# Patient Record
Sex: Male | Born: 1980 | Race: White | Hispanic: No | Marital: Married | Smoking: Former smoker
Health system: Southern US, Community
[De-identification: ages and names within clinical notes are randomized; demographics above are authoritative.]

## PROBLEM LIST (undated history)

## (undated) DIAGNOSIS — T7840XA Allergy, unspecified, initial encounter: Secondary | ICD-10-CM

## (undated) DIAGNOSIS — I10 Essential (primary) hypertension: Secondary | ICD-10-CM

## (undated) DIAGNOSIS — E785 Hyperlipidemia, unspecified: Secondary | ICD-10-CM

## (undated) DIAGNOSIS — F419 Anxiety disorder, unspecified: Secondary | ICD-10-CM

## (undated) HISTORY — PX: APPENDECTOMY: SHX54

## (undated) HISTORY — DX: Essential (primary) hypertension: I10

## (undated) HISTORY — DX: Anxiety disorder, unspecified: F41.9

## (undated) HISTORY — DX: Allergy, unspecified, initial encounter: T78.40XA

## (undated) HISTORY — DX: Hyperlipidemia, unspecified: E78.5

---
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
## 2010-02-07 DIAGNOSIS — J302 Other seasonal allergic rhinitis: Secondary | ICD-10-CM

## 2010-02-07 HISTORY — DX: Other seasonal allergic rhinitis: J30.2

## 2011-02-08 DIAGNOSIS — F419 Anxiety disorder, unspecified: Secondary | ICD-10-CM

## 2011-02-08 HISTORY — PX: WISDOM TOOTH EXTRACTION: SHX21

## 2011-02-08 HISTORY — DX: Anxiety disorder, unspecified: F41.9

## 2016-08-17 ENCOUNTER — Ambulatory Visit: Payer: Self-pay

## 2016-09-07 ENCOUNTER — Ambulatory Visit (INDEPENDENT_AMBULATORY_CARE_PROVIDER_SITE_OTHER): Payer: BLUE CROSS/BLUE SHIELD | Admitting: Urology

## 2016-09-07 ENCOUNTER — Encounter: Payer: Self-pay | Admitting: Urology

## 2016-09-07 VITALS — BP 116/67 | HR 76 | Ht 72.0 in | Wt >= 6400 oz

## 2016-09-07 DIAGNOSIS — Z3009 Encounter for other general counseling and advice on contraception: Secondary | ICD-10-CM

## 2016-09-07 MED ORDER — DIAZEPAM 10 MG PO TABS: 10.0000 mg | ORAL_TABLET | Freq: Once | ORAL | 0 refills | Status: AC

## 2016-09-07 MED ORDER — DIAZEPAM 10 MG PO TABS: 10.0000 mg | ORAL_TABLET | Freq: Once | ORAL | 0 refills | Status: DC

## 2016-09-07 NOTE — Progress Notes (Signed)
09/07/2016 11:59 AM   Danny Jenkins 10/31/1980 [id number]  Referring provider: No referring provider defined for this encounter.  Chief Complaint  Patient presents with  . VAS Consult    HPI: Here to consider vasectomy.  Married and has a three yo boy and 2 mo old daughter. They've considered vasectomy for about three years. They tried OCP's, [facility]. No inguinal / scrotal surgery or pain. No LUTS.    Modifying factors: There are no other modifying factors  Associated signs and symptoms: There are no other associated signs and symptoms Aggravating and relieving factors: There are no other aggravating or relieving factors Severity: Moderate Duration: Persistent    PMH: History reviewed. No pertinent past medical history.  Surgical History: Past Surgical History:  Procedure Laterality Date  . APPENDECTOMY    . EYE SURGERY  1987    Home Medications:  Allergies as of 09/07/2016      Reactions   Sulfa Antibiotics Hives      Medication List    as of 09/07/2016 11:59 AM   You have not been prescribed any medications.     Allergies:  Allergies  Allergen Reactions  . Sulfa Antibiotics Hives    Family History: Family History  Problem Relation Age of Onset  . Prostate cancer Neg Hx   . Bladder Cancer Neg Hx   . Kidney cancer Neg Hx     Social History:  reports that he has quit smoking. He has never used smokeless tobacco. He reports that he drinks alcohol. He reports that he does not use drugs.  ROS: UROLOGY Frequent Urination?: No Hard to postpone urination?: No Burning/pain with urination?: No Get up at night to urinate?: No Leakage of urine?: No Urine stream starts and stops?: No Trouble starting stream?: No Do you have to strain to urinate?: No Blood in urine?: No Urinary tract infection?: No Sexually transmitted disease?: No Injury to kidneys or bladder?: No Painful intercourse?: No Weak stream?: No Erection problems?:  No Penile pain?: No  Gastrointestinal Nausea?: No Vomiting?: No Indigestion/heartburn?: No Diarrhea?: No Constipation?: No  Constitutional Fever: No Night sweats?: No Weight loss?: No Fatigue?: No  Skin Skin rash/lesions?: No Itching?: No  Eyes Blurred vision?: No Double vision?: No  Ears/Nose/Throat Sore throat?: No Sinus problems?: No  Hematologic/Lymphatic Swollen glands?: No Easy bruising?: No  Cardiovascular Leg swelling?: No Chest pain?: No  Respiratory Cough?: No Shortness of breath?: No  Endocrine Excessive thirst?: No  Musculoskeletal Back pain?: No Joint pain?: No  Neurological Headaches?: No Dizziness?: No  Psychologic Depression?: No Anxiety?: No  Physical Exam: BP 116/67 (BP Location: Left Arm, Patient Position: Sitting, Cuff Size: Large)   Pulse 76   Ht 6' (1.829 m)   Wt (!) 194.9 kg (429 lb 9.6 oz)   BMI 58.26 kg/m   Constitutional:  Alert and oriented, No acute distress. HEENT: [place] AT, moist mucus membranes.  Trachea midline, no masses. Cardiovascular: No clubbing, cyanosis, or edema. Respiratory: Normal respiratory effort, no increased work of breathing. GI: Abdomen is soft, nontender, nondistended, no abdominal masses GU: No CVA tenderness.  Penis - buried Scrotum - normal, loose Testicles - easy to examine, testicles bilateral vas deferens palpably normal. Skin: No rashes, bruises or suspicious lesions. Lymph: No cervical or inguinal adenopathy. Neurologic: Grossly intact, no focal deficits, moving all 4 extremities. Psychiatric: Normal mood and affect.  Laboratory Data: No results found for: WBC, HGB, HCT, MCV, PLT  No results found for: CREATININE  No results found for: PSA  No results found for: TESTOSTERONE  No results found for: HGBA1C  Urinalysis No results found for: COLORURINE, APPEARANCEUR, LABSPEC, PHURINE, GLUCOSEU, HGBUR, BILIRUBINUR, KETONESUR, PROTEINUR, UROBILINOGEN, NITRITE,  LEUKOCYTESUR   Assessment & Plan:  The patient was prepped and draped in the usual sterile fashion. A timeout was performed to confirm the patient and procedure. The skin and left vas was grasped and infiltrated with local. A puncture hole was made with a sharp hemostat and the vas was grasped with a ring Clamp and And brought out through the incision. The sharp hemostat was used to clear the vasal fascia and once clear a 1 cm segment of the vas was excised and removed.The needlepoint Bovie was placed in the proximal and distal lumens and cautery was performed. The proximal and distal lumens were buried within the sheath with a 3-0 silk suture providing good fascial interposition. Hemostasis was excellent. The right vas was isolated and through the same puncture whole, was infiltrated with local, a segment excised, the lumens cauterized, and both ends buried under the sheath. Hemostasis was excellent. The puncture hole was closed with a single horizontal mattress suture. The patient tolerated the procedure well. He was cleaned and a dressing applied. Discussed again post-op restrictions. Also discussed again vasectomy does not produce immediate sterilization (which can take several months), therefore continue other birth control. Despite his size, the testicles and vas deferens are quite easy to access and examine. We discussed on occasion the procedure has to be done under anesthesia in the OR. Also,one of my colleagues may be doing the procedure.   There are no diagnoses linked to this encounter.  No Follow-up on file.  Jerilee FieldESKRIDGE, Adonys Wildes, MD  [facility] [address], [address] [place], [place] [address] [phone]

## 2016-11-02 ENCOUNTER — Encounter: Payer: BLUE CROSS/BLUE SHIELD | Admitting: Urology

## 2016-11-22 ENCOUNTER — Encounter: Payer: BLUE CROSS/BLUE SHIELD | Admitting: Urology

## 2016-12-15 ENCOUNTER — Ambulatory Visit: Payer: BLUE CROSS/BLUE SHIELD | Admitting: Nurse Practitioner

## 2016-12-15 ENCOUNTER — Encounter: Payer: Self-pay | Admitting: Nurse Practitioner

## 2016-12-15 VITALS — BP 129/73 | HR 77 | Temp 98.2°F | Ht 72.0 in | Wt >= 6400 oz

## 2016-12-15 DIAGNOSIS — E7800 Pure hypercholesterolemia, unspecified: Secondary | ICD-10-CM | POA: Diagnosis not present

## 2016-12-15 DIAGNOSIS — Z6841 Body Mass Index (BMI) 40.0 and over, adult: Secondary | ICD-10-CM

## 2016-12-15 DIAGNOSIS — Z7689 Persons encountering health services in other specified circumstances: Secondary | ICD-10-CM | POA: Diagnosis not present

## 2016-12-15 MED ORDER — MEDICAL COMPRESSION SOCKS MISC: 2.0000 | 0 refills | Status: DC | PRN

## 2016-12-15 NOTE — Patient Instructions (Addendum)
Danny Jenkins, Thank you for coming in to clinic today.  1. For your hypertension, hyperlipidemia, and obesity. - Continue work for weight loss and diet.  2. Compression socks for long flights > 4 hours. [facility] [address] [place], [place] [address] phone: [phone] fax: [phone] [email or web address]  Please schedule a follow-up appointment with Danny Jenkins, AGNP. Return in about 6 months (around 06/14/2017) for cholesterol, hypertension.  If you have any other questions or concerns, please feel free to call the clinic or send a message through MyChart. You may also schedule an earlier appointment if necessary.  You will receive a survey after today's visit either digitally by e-mail or paper by [employer] mail. Your experiences and feedback matter to us.  Please respond so we know how we are doing as we provide care for you.   Danny McardleLauren Herbie Lehrmann, DNP, AGNP-BC Adult Gerontology Nurse Practitioner [facility]

## 2016-12-15 NOTE — Progress Notes (Signed)
Subjective:    Patient ID: Danny Jenkins, male    DOB: 03/09/1980, 36 y.o.   MRN: [id number]  Danny Jenkins is a 36 y.o. male presenting on 12/15/2016 for Establish Care (hypertension, dicuss diet & lifestyle changes )   HPI Establish Care New Provider Last PCP was with Dr. Veneta PentonPaul Diamond and his last visit about 2 years ago.   Morbid Obesity Diet and lifestyle changes: - Started at 460 lbs plus.  1 mo ago started ketogenic diet.  Has been working well and has lost about 30 lbs. - Goal less than 300 long-term.  First goal is to weigh less than 400 lbs. - Increased activity w/ yardwork and 2-3 times per week walking.  Upcoming Travel (approx 14 hours):  Pt travels by air frequently for work and has not had significant difficulty on shorter flights, but notes more pain and leg swelling for flights > 4 hours. - In past for travel or any prolonged sitting > 2 hours, pt experiences compression of nerves w/ "red hot pain in thighs." - Pt also notes more leg swelling when he is sitting more frequently.  Less swelling w/ walking, but needs to frequently elevate legs to control.   - Requests accommodation for flights longer than 4.5 hours.  His employer has suggested ADA form should be submitted describing needed accommodation for larger/lie flat seats.  Pt has discussed need for extra legroom/first class seat for all flights > 4 hours and first class/lie flat seat for all flights > 8 hours.  Past Medical History:  Diagnosis Date  . Allergy   . Anxiety   . Hyperlipidemia   . Hypertension    Past Surgical History:  Procedure Laterality Date  . APPENDECTOMY    . EYE SURGERY  1987   Social History   Socioeconomic History  . Marital status: Married    Spouse name: Not on file  . Number of children: Not on file  . Years of education: Not on file  . Highest education level: Not on file  Social Needs  . Financial resource strain: Not on file  . Food insecurity - worry: Not on  file  . Food insecurity - inability: Not on file  . Transportation needs - medical: Not on file  . Transportation needs - non-medical: Not on file  Occupational History  . Not on file  Tobacco Use  . Smoking status: Former Games developermoker  . Smokeless tobacco: Never Used  Substance and Sexual Activity  . Alcohol use: Yes  . Drug use: No  . Sexual activity: Not on file  Other Topics Concern  . Not on file  Social History Narrative  . Not on file   Family History  Problem Relation Age of Onset  . Hyperlipidemia Mother   . Heart attack Father   . CAD Father        2 stents  . Hyperlipidemia Father   . Prostate cancer Neg Hx   . Bladder Cancer Neg Hx   . Kidney cancer Neg Hx    Current Outpatient Medications on File Prior to Visit  Medication Sig  . FLUCELVAX QUADRIVALENT 0.5 ML SUSY    No current facility-administered medications on file prior to visit.     Review of Systems  Constitutional: Negative.   HENT: Negative.   Eyes: Negative.   Respiratory: Positive for shortness of breath (with exertion,  resolves w/ rest).   Cardiovascular: Positive for leg swelling.  Gastrointestinal: Negative.   Endocrine: Negative.  Genitourinary: Negative.   Musculoskeletal: Positive for arthralgias, back pain and myalgias.  Skin: Negative.   Allergic/Immunologic: Negative.   Neurological: Negative.   Hematological: Negative.   Psychiatric/Behavioral: Negative.    Per HPI unless specifically indicated above     Objective:    BP 129/73 (BP Location: Left Arm, Patient Position: Sitting, Cuff Size: Large) Comment: left arm  Pulse 77   Temp 98.2 F (36.8 C) (Oral)   Ht 6' (1.829 m)   Wt (!) 439 lb (199.1 kg)   BMI 59.54 kg/m   Wt Readings from Last 3 Encounters:  12/15/16 (!) 439 lb (199.1 kg)  09/07/16 (!) 429 lb 9.6 oz (194.9 kg)    Physical Exam  Constitutional: He is oriented to person, place, and time. He appears well-developed.  Morbidly obese  HENT:  Head:  Normocephalic and atraumatic.  Nose: Nose normal.  Mouth/Throat: Oropharynx is clear and moist.  Neck: Normal range of motion. Neck supple. No JVD present. No tracheal deviation present. No thyromegaly present.  Cardiovascular: Normal rate, regular rhythm, normal heart sounds and intact distal pulses.  Pulmonary/Chest: Effort normal and breath sounds normal. No respiratory distress. He has no wheezes.  Abdominal: Soft. Bowel sounds are normal.  Musculoskeletal: He exhibits edema (Generalized upper and lower extremity edema - +2 pitting pedal edema).  Lymphadenopathy:    He has no cervical adenopathy.       Right: Inguinal (mild to moderate) adenopathy present.       Left: Inguinal (mild to moderate) adenopathy present.  Neurological: He is alert and oriented to person, place, and time. No cranial nerve deficit. Gait (antalgic) abnormal.  Skin: Skin is warm and dry.  Psychiatric: He has a normal mood and affect. His behavior is normal. Judgment and thought content normal.   Results for orders placed or performed in visit on 12/15/16  Lipid panel  Result Value Ref Range   Cholesterol 259 (H) <200 mg/dL   HDL 53 >16>40 mg/dL   Triglycerides 94 <109<150 mg/dL   LDL Cholesterol (Calc) 185 (H) mg/dL (calc)   Total CHOL/HDL Ratio 4.9 <5.0 (calc)   Non-HDL Cholesterol (Calc) 206 (H) <130 mg/dL (calc)  Comprehensive metabolic panel  Result Value Ref Range   Glucose, Bld 93 65 - 99 mg/dL   BUN 14 7 - 25 mg/dL   Creat 6.040.81 5.400.60 - 9.811.35 mg/dL   BUN/Creatinine Ratio NOT APPLICABLE 6 - 22 (calc)   Sodium 139 135 - 146 mmol/L   Potassium 4.2 3.5 - 5.3 mmol/L   Chloride 103 98 - 110 mmol/L   CO2 26 20 - 32 mmol/L   Calcium 10.1 8.6 - 10.3 mg/dL   Total Protein 7.6 6.1 - 8.1 g/dL   Albumin 5.0 3.6 - 5.1 g/dL   Globulin 2.6 1.9 - 3.7 g/dL (calc)   AG Ratio 1.9 1.0 - 2.5 (calc)   Total Bilirubin 0.6 0.2 - 1.2 mg/dL   Alkaline phosphatase (APISO) 67 40 - 115 U/L   AST 14 10 - 40 U/L   ALT 18 9 - 46  U/L  Hemoglobin A1c  Result Value Ref Range   Hgb A1c MFr Bld 5.4 <5.7 % of total Hgb   Mean Plasma Glucose 108 (calc)   eAG (mmol/L) 6.0 (calc)      Assessment & Plan:   Problem List Items Addressed This Visit      Other   Hyperlipidemia    Pt w/ hypercholesterolemia on labs from visit.  Discussed in visit that  pt will like to start statin m edication if lipids elevated.  Plan: 1. START atorvastatin 20 mg once daily. 2. Followup w/ lipid panel in 6 months.      Relevant Medications   atorvastatin (LIPITOR) 20 MG tablet   Class 3 severe obesity due to excess calories with serious comorbidity and body mass index (BMI) of 60.0 to 69.9 in adult [facility]) - Primary    Severe morbid obesity w/ complications of lymphedema, hypercholesterolemia.  Pt w/ improvement in weight by approx 30 lbs in last 1 month using ketogenic diet. Pt at risk for DVT and decreased circulation with prolonged air travel. Pt appears to have healthy perspective about weight loss w/ motivations for living a healthier lifestyle for his child.  Plan: 1. Discussed safety risks of ketogenic diet for cholesterol and nutritional/electrolyte abnormalities.  Discussed w/ pt may need to alter for lower fat, low carb (not severe restriction), higher vegetables.  Provided low glycemic handout.  Ultimately, diet that is tolerated and is successful for weight loss will be good for long term, but will need to learn how to eat w/ carbs and maintain weight once complete.  Pt verbalizes he understands risks vs benefits. 2. Encouraged continued weight loss and increased physical activity. 3. Discussed w/ pt need to wear compression socks when flying and will have benefit if wearing all the time.  Rx written.  Instructions for measurement at [facility]. 4. Completed ADA paperwork for workplace accomodations for appropriate airplane seats for duration of flights as in HPI.   5. Screen labs for A1c DM screening, Lipids, Liver  function (metabolic syndrome screen). 6. Followup 6 months.      Relevant Medications   Elastic Bandages & Supports (MEDICAL COMPRESSION SOCKS) MISC   Other Relevant Orders   Lipid panel (Completed)   Comprehensive metabolic panel (Completed)   Hemoglobin A1c (Completed)    Other Visit Diagnoses    Encounter to establish care          Meds ordered this encounter  Medications  . FLUCELVAX QUADRIVALENT 0.5 ML SUSY    Refill:  0  . DISCONTD: Elastic Bandages & Supports (MEDICAL COMPRESSION SOCKS) MISC    Sig: 2 Devices as needed by Does not apply route (flights and prolonged sitting).    Dispense:  2 each    Refill:  0    Order Specific Question:   Supervising Provider    Answer:   Smitty Cords [[id number]]  . Elastic Bandages & Supports (MEDICAL COMPRESSION SOCKS) MISC    Sig: 2 Devices as needed by Does not apply route (flights and prolonged sitting).    Dispense:  2 each    Refill:  0  . atorvastatin (LIPITOR) 20 MG tablet    Sig: Take 1 tablet (20 mg total) daily by mouth.    Dispense:  90 tablet    Refill:  3    Order Specific Question:   Supervising Provider    Answer:   Smitty Cords [[id number]]      Follow up plan: Return in about 6 months (around 06/14/2017) for cholesterol, hypertension.  Wilhelmina Mcardle, DNP, AGPCNP-BC Adult Gerontology Primary Care Nurse Practitioner [facility] 12/28/2016, 8:51 AM

## 2016-12-16 LAB — COMPREHENSIVE METABOLIC PANEL
AG Ratio: 1.9 (calc) (ref 1.0–2.5)
ALT: 18 U/L (ref 9–46)
AST: 14 U/L (ref 10–40)
Albumin: 5 g/dL (ref 3.6–5.1)
Alkaline phosphatase (APISO): 67 U/L (ref 40–115)
BUN: 14 mg/dL (ref 7–25)
CO2: 26 mmol/L (ref 20–32)
Calcium: 10.1 mg/dL (ref 8.6–10.3)
Chloride: 103 mmol/L (ref 98–110)
Creat: 0.81 mg/dL (ref 0.60–1.35)
Globulin: 2.6 g/dL (calc) (ref 1.9–3.7)
Glucose, Bld: 93 mg/dL (ref 65–99)
Potassium: 4.2 mmol/L (ref 3.5–5.3)
Sodium: 139 mmol/L (ref 135–146)
Total Bilirubin: 0.6 mg/dL (ref 0.2–1.2)
Total Protein: 7.6 g/dL (ref 6.1–8.1)

## 2016-12-16 LAB — LIPID PANEL
Cholesterol: 259 mg/dL — ABNORMAL HIGH (ref <200)
HDL: 53 mg/dL (ref >40)
LDL Cholesterol (Calc): 185 mg/dL (calc) — ABNORMAL HIGH
Non-HDL Cholesterol (Calc): 206 mg/dL (calc) — ABNORMAL HIGH (ref <130)
Total CHOL/HDL Ratio: 4.9 (calc) (ref <5.0)
Triglycerides: 94 mg/dL (ref <150)

## 2016-12-16 LAB — HEMOGLOBIN A1C
Hgb A1c MFr Bld: 5.4 % of total Hgb (ref <5.7)
Mean Plasma Glucose: 108 (calc)
eAG (mmol/L): 6 (calc)

## 2016-12-16 MED ORDER — ATORVASTATIN CALCIUM 20 MG PO TABS: 20.0000 mg | ORAL_TABLET | Freq: Every day | ORAL | 3 refills | Status: DC

## 2016-12-28 ENCOUNTER — Encounter: Payer: Self-pay | Admitting: Nurse Practitioner

## 2016-12-28 DIAGNOSIS — E66813 Obesity, class 3: Secondary | ICD-10-CM | POA: Insufficient documentation

## 2016-12-28 DIAGNOSIS — Z6841 Body Mass Index (BMI) 40.0 and over, adult: Principal | ICD-10-CM

## 2016-12-28 DIAGNOSIS — E785 Hyperlipidemia, unspecified: Secondary | ICD-10-CM | POA: Insufficient documentation

## 2016-12-28 NOTE — Assessment & Plan Note (Addendum)
Severe morbid obesity w/ complications of lymphedema, hypercholesterolemia.  Pt w/ improvement in weight by approx 30 lbs in last 1 month using ketogenic diet. Pt at risk for DVT and decreased circulation with prolonged air travel. Pt appears to have healthy perspective about weight loss w/ motivations for living a healthier lifestyle for his child.  Plan: 1. Discussed safety risks of ketogenic diet for cholesterol and nutritional/electrolyte abnormalities.  Discussed w/ pt may need to alter for lower fat, low carb (not severe restriction), higher vegetables.  Provided low glycemic handout.  Ultimately, diet that is tolerated and is successful for weight loss will be good for long term, but will need to learn how to eat w/ carbs and maintain weight once complete.  Pt verbalizes he understands risks vs benefits. 2. Encouraged continued weight loss and increased physical activity. 3. Discussed w/ pt need to wear compression socks when flying and will have benefit if wearing all the time.  Rx written.  [facility]. 4. Completed ADA paperwork for workplace accomodations for appropriate airplane seats for duration of flights as in HPI.   5. Screen labs for A1c DM screening, Lipids, Liver function (metabolic syndrome screen). 6. Followup 6 months.

## 2016-12-28 NOTE — Assessment & Plan Note (Signed)
Pt w/ hypercholesterolemia on labs from visit.  Discussed in visit that pt will like to start statin m edication if lipids elevated.  Plan: 1. START atorvastatin 20 mg once daily. 2. Followup w/ lipid panel in 6 months.

## 2017-06-14 ENCOUNTER — Ambulatory Visit: Payer: BLUE CROSS/BLUE SHIELD | Admitting: Nurse Practitioner

## 2018-02-07 DIAGNOSIS — E785 Hyperlipidemia, unspecified: Secondary | ICD-10-CM

## 2018-02-07 HISTORY — DX: Hyperlipidemia, unspecified: E78.5

## 2018-12-14 ENCOUNTER — Other Ambulatory Visit: Payer: Self-pay

## 2018-12-14 ENCOUNTER — Encounter: Payer: Self-pay | Admitting: Emergency Medicine

## 2018-12-14 ENCOUNTER — Emergency Department
Admission: EM | Admit: 2018-12-14 | Discharge: 2018-12-15 | Disposition: A | Discharge status: Home / Self Care | Payer: BC Managed Care – PPO | Attending: Emergency Medicine | Admitting: Emergency Medicine

## 2018-12-14 ENCOUNTER — Emergency Department: Payer: BC Managed Care – PPO

## 2018-12-14 DIAGNOSIS — R079 Chest pain, unspecified: Secondary | ICD-10-CM | POA: Diagnosis present

## 2018-12-14 DIAGNOSIS — Z87891 Personal history of nicotine dependence: Secondary | ICD-10-CM | POA: Insufficient documentation

## 2018-12-14 DIAGNOSIS — R0789 Other chest pain: Secondary | ICD-10-CM | POA: Diagnosis not present

## 2018-12-14 DIAGNOSIS — I10 Essential (primary) hypertension: Secondary | ICD-10-CM | POA: Diagnosis not present

## 2018-12-14 DIAGNOSIS — Z79899 Other long term (current) drug therapy: Secondary | ICD-10-CM | POA: Insufficient documentation

## 2018-12-14 LAB — CBC
HCT: 41.3 % (ref 39.0–52.0)
Hemoglobin: 13.5 g/dL (ref 13.0–17.0)
MCH: 26.1 pg (ref 26.0–34.0)
MCHC: 32.7 g/dL (ref 30.0–36.0)
MCV: 79.9 fL — ABNORMAL LOW (ref 80.0–100.0)
Platelets: 269 10*3/uL (ref 150–400)
RBC: 5.17 MIL/uL (ref 4.22–5.81)
RDW: 13.6 % (ref 11.5–15.5)
WBC: 8.4 10*3/uL (ref 4.0–10.5)
nRBC: 0 % (ref 0.0–0.2)

## 2018-12-14 LAB — BASIC METABOLIC PANEL
Anion gap: 9 (ref 5–15)
BUN: 14 mg/dL (ref 6–20)
CO2: 27 mmol/L (ref 22–32)
Calcium: 9.3 mg/dL (ref 8.9–10.3)
Chloride: 103 mmol/L (ref 98–111)
Creatinine, Ser: 0.97 mg/dL (ref 0.61–1.24)
GFR calc Af Amer: >60 mL/min (ref >60)
GFR calc non Af Amer: >60 mL/min (ref >60)
Glucose, Bld: 100 mg/dL — ABNORMAL HIGH (ref 70–99)
Potassium: 4 mmol/L (ref 3.5–5.1)
Sodium: 139 mmol/L (ref 135–145)

## 2018-12-14 LAB — TROPONIN I (HIGH SENSITIVITY): Troponin I (High Sensitivity): 2 ng/L (ref <18)

## 2018-12-14 NOTE — ED Triage Notes (Signed)
Patient with complaint of left side chest pain radiating down his left arm that started about two and half hours ago. Patient states that he was diaphoretic. Patient denies shob or nausea and vomiting.

## 2018-12-14 NOTE — ED Provider Notes (Signed)
[facility] Emergency Department Provider Note  ____________________________________________   First MD Initiated Contact with Patient 12/14/18 2343     (approximate)  I have reviewed the triage vital signs and the nursing notes.   HISTORY  Chief Complaint Chest Pain    HPI Danny Jenkins is a 38 y.o. male with anxiety, hyperlipidemia, hypertension who presents with left-sided chest pain.  Patient states around 6 PM he developed left-sided chest pain that was a dull ache, constant, went down to his left arm, associate with chilliness, got better on its own currently very mild in nature.  He says that his father had a heart attack at age 45 so he was worried about that.  Denies any exertional chest pain prior to this event.  Denies any shortness of breath, leg swelling, other PE risk factors.  Triage note stated that the pain is started tonight hours prior to presentation but patient clarified with me that it was definitely at 6 PM because it was when he was at dinner with his children.     Past Medical History:  Diagnosis Date  . Allergy   . Anxiety   . Hyperlipidemia   . Hypertension     Patient Active Problem List   Diagnosis Date Noted  . Hyperlipidemia 12/28/2016  . Class 3 severe obesity due to excess calories with serious comorbidity and body mass index (BMI) of 60.0 to 69.9 in adult [facility]) 12/28/2016    Past Surgical History:  Procedure Laterality Date  . APPENDECTOMY    . EYE SURGERY  1987    Prior to Admission medications   Medication Sig Start Date End Date Taking? Authorizing Provider  atorvastatin (LIPITOR) 20 MG tablet Take 1 tablet (20 mg total) daily by mouth. 12/16/16   Galen Manila, NP  Elastic Bandages & Supports (MEDICAL COMPRESSION SOCKS) MISC 2 Devices as needed by Does not apply route (flights and prolonged sitting). 12/15/16   Galen Manila, NP  FLUCELVAX QUADRIVALENT 0.5 ML SUSY  11/14/16   [provider]    Allergies Sulfa antibiotics  Family History  Problem Relation Age of Onset  . Hyperlipidemia Mother   . Heart attack Father   . CAD Father        2 stents  . Hyperlipidemia Father   . Prostate cancer Neg Hx   . Bladder Cancer Neg Hx   . Kidney cancer Neg Hx     Social History Social History   Tobacco Use  . Smoking status: Former Games developer  . Smokeless tobacco: Never Used  Substance Use Topics  . Alcohol use: Yes    Comment: rarley  . Drug use: No      Review of Systems Constitutional: Positive chills Eyes: No visual changes. ENT: No sore throat. Cardiovascular: Positive chest pain Respiratory: Denies shortness of breath. Gastrointestinal: No abdominal pain.  No nausea, no vomiting.  No diarrhea.  No constipation. Genitourinary: Negative for dysuria. Musculoskeletal: Negative for back pain. Skin: Negative for rash. Neurological: Negative for headaches, focal weakness or numbness. All other ROS negative ____________________________________________   PHYSICAL EXAM:  VITAL SIGNS: ED Triage Vitals  Enc Vitals Group     BP 12/14/18 2154 (!) 166/96     Pulse Rate 12/14/18 2154 96     Resp 12/14/18 2154 18     Temp 12/14/18 2154 97.9 F (36.6 C)     Temp Source 12/14/18 2154 Oral     SpO2 12/14/18 2154 96 %  Weight 12/14/18 2155 (!) 450 lb (204.1 kg)     Height 12/14/18 2155 6' (1.829 m)     Head Circumference --      Peak Flow --      Pain Score 12/14/18 2155 3     Pain Loc --      Pain Edu? --      Excl. in [place]? --     Constitutional: Alert and oriented. Well appearing and in no acute distress. Eyes: Conjunctivae are normal. EOMI. Head: Atraumatic. Nose: No congestion/rhinnorhea. Mouth/Throat: Mucous membranes are moist.   Neck: No stridor. Trachea Midline. FROM Cardiovascular: Normal rate, regular rhythm. Grossly normal heart sounds.  Good peripheral circulation.  No chest wall tenderness Respiratory: Normal respiratory effort.   No retractions. Lungs CTAB. Gastrointestinal: Soft and nontender. No distention. No abdominal bruits.  Musculoskeletal: No lower extremity tenderness nor edema.  No joint effusions. Neurologic:  Normal speech and language. No gross focal neurologic deficits are appreciated.  Skin:  Skin is warm, dry and intact. No rash noted. Psychiatric: Mood and affect are normal. Speech and behavior are normal. GU: Deferred   ____________________________________________   LABS (all labs ordered are listed, but only abnormal results are displayed)  Labs Reviewed  BASIC METABOLIC PANEL - Abnormal; Notable for the following components:      Result Value   Glucose, Bld 100 (*)    All other components within normal limits  CBC - Abnormal; Notable for the following components:   MCV 79.9 (*)    All other components within normal limits  TROPONIN I (HIGH SENSITIVITY)  TROPONIN I (HIGH SENSITIVITY)   ____________________________________________   ED ECG REPORT I, Vanessa [place], the attending physician, personally viewed and interpreted this ECG.  EKG is normal sinus rate of 96, no salvation, no T wave inversion, normal intervals ____________________________________________  RADIOLOGY Robert Bellow, personally viewed and evaluated these images (plain radiographs) as part of my medical decision making, as well as reviewing the written report by the radiologist.  ED MD interpretation: No pneumonia or pneumothorax noted  Official radiology report(s): Dg Chest 2 View  Result Date: 12/14/2018 CLINICAL DATA:  Chest pain EXAM: CHEST - 2 VIEW COMPARISON:  None. FINDINGS: The heart size and mediastinal contours are within normal limits. Both lungs are clear. The visualized skeletal structures are unremarkable. IMPRESSION: No active cardiopulmonary disease. Electronically Signed   By: Prudencio Pair M.D.   On: 12/14/2018 22:19    ____________________________________________   PROCEDURES  Procedure(s)  performed (including Critical Care):  Procedures   ____________________________________________   INITIAL IMPRESSION / ASSESSMENT AND PLAN / ED COURSE   Danny Jenkins was evaluated in Emergency Department on 12/14/2018 for the symptoms described in the history of present illness. He was evaluated in the context of the global COVID-19 pandemic, which necessitated consideration that the patient might be at risk for infection with the SARS-CoV-2 virus that causes COVID-19. Institutional protocols and algorithms that pertain to the evaluation of patients at risk for COVID-19 are in a state of rapid change based on information released by regulatory bodies including the CDC and federal and state organizations. These policies and algorithms were followed during the patient's care in the ED.    Most Likely DDx:  -MSK (atypical chest pain) but will get cardiac markers to evaluate for ACS given risk factors/age   DDx that was also considered d/t potential to cause harm, but was found less likely based on history and physical (as detailed above): -  PNA (no fevers, cough but CXR to evaluate) -PNX (reassured with equal b/l breath sounds, CXR to evaluate) -Symptomatic anemia (will get H&H) -Pulmonary embolism as no sob at rest, not pleuritic in nature, no hypoxia. PERC NEGATIVE  -Aortic Dissection as no tearing pain and no radiation to the mid back, pulses equal -Pericarditis no rub on exam, EKG changes or hx to suggest dx -Tamponade (no notable SOB, tachycardic, hypotensive) -Esophageal rupture (no h/o diffuse vomitting/no crepitus)   Heart score is 3.  Given patient's risk factors I discussed following up with cardiology as needed for his hypertension.  We discussed getting a second troponin however patient is had a percent that his pain started at 6:00 and he had his marker drawn at 10:00 which makes it greater than 3 hours and algorithm It would rule him out.  Patient feels comfortable with  this plan.  I discussed the provisional nature of ED diagnosis, the treatment so far, the ongoing plan of care, follow up appointments and return precautions with the patient and any family or support people present. They expressed understanding and agreed with the plan, discharged home.     ____________________________________________   FINAL CLINICAL IMPRESSION(S) / ED DIAGNOSES   Final diagnoses:  Other chest pain     MEDICATIONS GIVEN DURING THIS VISIT:  Medications  acetaminophen (TYLENOL) tablet 1,000 mg (has no administration in time range)  ketorolac (TORADOL) 30 MG/ML injection 30 mg (has no administration in time range)     ED Discharge Orders    None       Note:  This document was prepared using Dragon voice recognition software and may include unintentional dictation errors.   Concha SeFunke, Rue Tinnel E, MD 12/15/18 [phone]

## 2018-12-15 MED ORDER — ACETAMINOPHEN 500 MG PO TABS
1000.0000 mg | ORAL_TABLET | Freq: Once | ORAL | Status: AC
  Administered 2018-12-15: 1000 mg via ORAL
  Filled 2018-12-15: qty 2

## 2018-12-15 MED ORDER — KETOROLAC TROMETHAMINE 30 MG/ML IJ SOLN
30.0000 mg | Freq: Once | INTRAMUSCULAR | Status: AC
  Administered 2018-12-15: 30 mg via INTRAMUSCULAR
  Filled 2018-12-15: qty 1

## 2018-12-15 NOTE — Discharge Instructions (Addendum)
Your work-up was reassuring with no evidence of heart attack however you do have some risk factors for cardiac disease so we would recommend you following up with cardiology.  Your blood pressure was also slightly hypertensive so she should have this rechecked to see if need to have your blood pressure meds titrated.  Return to the ER for worsening shortness of breath, chest pain or any other concerns

## 2019-09-20 ENCOUNTER — Ambulatory Visit
Admission: RE | Admit: 2019-09-20 | Discharge: 2019-09-20 | Disposition: A | Discharge status: Home / Self Care | Payer: Managed Care, Other (non HMO) | Source: Ambulatory Visit | Attending: Internal Medicine | Admitting: Internal Medicine

## 2019-09-20 ENCOUNTER — Encounter: Payer: Self-pay | Admitting: Emergency Medicine

## 2019-09-20 ENCOUNTER — Other Ambulatory Visit: Payer: Self-pay

## 2019-09-20 ENCOUNTER — Ambulatory Visit
Admission: EM | Admit: 2019-09-20 | Discharge: 2019-09-20 | Disposition: A | Discharge status: Home / Self Care | Payer: Managed Care, Other (non HMO) | Attending: Internal Medicine | Admitting: Internal Medicine

## 2019-09-20 DIAGNOSIS — M79604 Pain in right leg: Secondary | ICD-10-CM

## 2019-09-20 LAB — BASIC METABOLIC PANEL
Anion gap: 9 (ref 5–15)
BUN: 13 mg/dL (ref 6–20)
CO2: 25 mmol/L (ref 22–32)
Calcium: 9 mg/dL (ref 8.9–10.3)
Chloride: 100 mmol/L (ref 98–111)
Creatinine, Ser: 0.92 mg/dL (ref 0.61–1.24)
GFR calc Af Amer: >60 mL/min (ref >60)
GFR calc non Af Amer: >60 mL/min (ref >60)
Glucose, Bld: 100 mg/dL — ABNORMAL HIGH (ref 70–99)
Potassium: 4.4 mmol/L (ref 3.5–5.1)
Sodium: 134 mmol/L — ABNORMAL LOW (ref 135–145)

## 2019-09-20 NOTE — ED Triage Notes (Signed)
Patient c/o right thigh pain off and on for a week.  Patient states that his leg cramps are worse in the evening after work.

## 2019-09-24 NOTE — ED Provider Notes (Signed)
[facility]    CSN: [id number] Arrival date & time: 09/20/19  1658      History   Chief Complaint Chief Complaint  Patient presents with   Leg Pain    right thigh    HPI Danny Jenkins is a 39 y.o. male comes to the urgent care with complaints of intermittent right thigh cramping pain as well as right leg pain over the past week.  Patient has been experiencing these worsening pain is mainly at the end of the day.  He works from home symptomatic sitting at the desk for the most part of the day but he manages to get up a few times to walk around and stretch.  He came to the urgent care because of worsening pain/cramps in the leg.  He was worried about possibility of having blood clots.  He denies any shortness of breath, chest pain or chest pressure.   HPI  Past Medical History:  Diagnosis Date   Allergy    Anxiety    Hyperlipidemia    Hypertension     Patient Active Problem List   Diagnosis Date Noted   Hyperlipidemia 12/28/2016   Class 3 severe obesity due to excess calories with serious comorbidity and body mass index (BMI) of 60.0 to 69.9 in adult [facility]) 12/28/2016    Past Surgical History:  Procedure Laterality Date   APPENDECTOMY     EYE SURGERY  1987       Home Medications    Prior to Admission medications   Medication Sig Start Date End Date Taking? Authorizing Provider  aspirin EC 81 MG tablet Take 81 mg by mouth daily. Swallow whole.   Yes [provider]  Elastic Bandages & Supports (MEDICAL COMPRESSION SOCKS) MISC 2 Devices as needed by Does not apply route (flights and prolonged sitting). 12/15/16  Yes Galen Manila, NP  atorvastatin (LIPITOR) 20 MG tablet Take 1 tablet (20 mg total) daily by mouth. 12/16/16 09/20/19  Galen Manila, NP    Family History Family History  Problem Relation Age of Onset   Hyperlipidemia Mother    Heart attack Father    CAD Father        2 stents   Hyperlipidemia  Father    Prostate cancer Neg Hx    Bladder Cancer Neg Hx    Kidney cancer Neg Hx     Social History Social History   Tobacco Use   Smoking status: Former Smoker   Smokeless tobacco: Never Used  Building services engineer Use: Never used  Substance Use Topics   Alcohol use: Yes    Comment: rarley   Drug use: No     Allergies   Sulfa antibiotics   Review of Systems Review of Systems  Respiratory: Negative for cough, chest tightness, shortness of breath and wheezing.   Cardiovascular: Negative for chest pain and palpitations.  Musculoskeletal: Positive for myalgias. Negative for arthralgias, neck pain and neck stiffness.  Neurological: Negative for dizziness, light-headedness, numbness and headaches.     Physical Exam Triage Vital Signs ED Triage Vitals  Enc Vitals Group     BP 09/20/19 1734 140/85     Pulse Rate 09/20/19 1734 92     Resp 09/20/19 1734 16     Temp 09/20/19 1734 98.7 F (37.1 C)     Temp Source 09/20/19 1734 Oral     SpO2 09/20/19 1734 99 %     Weight 09/20/19 1731 (!) 440 lb (199.6 kg)  Height 09/20/19 1731 6' (1.829 m)     Head Circumference --      Peak Flow --      Pain Score 09/20/19 1730 4     Pain Loc --      Pain Edu? --      Excl. in [place]? --    No data found.  Updated Vital Signs BP 140/85 (BP Location: Left Arm)    Pulse 92    Temp 98.7 F (37.1 C) (Oral)    Resp 16    Ht 6' (1.829 m)    Wt (!) 199.6 kg    SpO2 99%    BMI 59.67 kg/m   Visual Acuity Right Eye Distance:   Left Eye Distance:   Bilateral Distance:    Right Eye Near:   Left Eye Near:    Bilateral Near:     Physical Exam Constitutional:      General: He is not in acute distress.    Appearance: Normal appearance. He is obese. He is not ill-appearing.  Cardiovascular:     Rate and Rhythm: Normal rate and regular rhythm.     Pulses: Normal pulses.     Heart sounds: Normal heart sounds.  Pulmonary:     Effort: Pulmonary effort is normal.     Breath  sounds: Normal breath sounds.  Abdominal:     General: Bowel sounds are normal.     Palpations: Abdomen is soft.  Musculoskeletal:        General: No swelling, tenderness or deformity. Normal range of motion.     Right lower leg: No edema.     Left lower leg: No edema.  Skin:    General: Skin is warm.     Coloration: Skin is not jaundiced.  Neurological:     Mental Status: He is alert.      UC Treatments / Results  Labs (all labs ordered are listed, but only abnormal results are displayed) Labs Reviewed  BASIC METABOLIC PANEL - Abnormal; Notable for the following components:      Result Value   Sodium 134 (*)    Glucose, Bld 100 (*)    All other components within normal limits    EKG   Radiology No results found.  Procedures Procedures (including critical care time)  Medications Ordered in UC Medications - No data to display  Initial Impression / Assessment and Plan / UC Course  I have reviewed the triage vital signs and the nursing notes.  Pertinent labs & imaging results that were available during my care of the patient were reviewed by me and considered in my medical decision making (see chart for details).     1.  Cramping thigh pain:.   BMP to evaluate for electrolyte abnormalities Venous ultrasound to rule out deep vein thrombosis It appears that the patient has a switch to that he rests the right leg: For the most part of the day.  That may be the reason why he is having the cramps in her leg.  Gentle stretching and more frequent walking around will be helpful in reducing the symptoms.  Return precautions given. Final Clinical Impressions(s) / UC Diagnoses   Final diagnoses:  Right leg pain   Discharge Instructions   None    ED Prescriptions    None     PDMP not reviewed this encounter.   Merrilee Jansky, MD 09/24/19 2135

## 2019-11-05 ENCOUNTER — Encounter: Payer: Self-pay | Admitting: Cardiovascular Disease

## 2019-11-05 ENCOUNTER — Ambulatory Visit: Payer: Managed Care, Other (non HMO) | Admitting: Cardiovascular Disease

## 2019-11-05 ENCOUNTER — Other Ambulatory Visit: Payer: Self-pay

## 2019-11-05 VITALS — BP 128/92 | HR 85 | Ht 72.0 in | Wt >= 6400 oz

## 2019-11-05 DIAGNOSIS — I499 Cardiac arrhythmia, unspecified: Secondary | ICD-10-CM | POA: Diagnosis not present

## 2019-11-05 MED ORDER — METOPROLOL TARTRATE 25 MG PO TABS: 25.0000 mg | ORAL_TABLET | Freq: Two times a day (BID) | ORAL | 3 refills | Status: DC | PRN

## 2019-11-05 MED ORDER — ROSUVASTATIN CALCIUM 10 MG PO TABS: 10.0000 mg | ORAL_TABLET | Freq: Every day | ORAL | 3 refills | Status: DC

## 2019-11-05 NOTE — Patient Instructions (Addendum)
Medication Instructions:   1. Take Metoprolol Tartrate 25 mg up to twice a day as needed 2. Take Rosuvastatin (Crestor) 10 mg daily   If you need a refill on your cardiac medications before your next appointment, please call your pharmacy.    Lab work: Lipids and LFT's today  Repeat lipids & LFT in 3 months. This would be around December 28th of this year. Go to the [facility] Entrance and check in at registration desk for labs to be done. No appointment is needed for this to be done. Make sure not to eat or drink anything after midnight before except small sip of water with your medications.    If you have labs (blood work) drawn today and your tests are completely normal, you will receive your results only by:  MyChart Message (if you have MyChart) OR  A paper copy in the mail If you have any lab test that is abnormal or we need to change your treatment, we will call you to review the results.   Testing/Procedures: No new testing needed   Follow-Up: At [facility], you and your health needs are our priority.  As part of our continuing mission to provide you with exceptional heart care, we have created designated Provider Care Teams.  These Care Teams include your primary Cardiologist (physician) and Advanced Practice Providers (APPs -  Physician Assistants and Nurse Practitioners) who all work together to provide you with the care you need, when you need it.  You will need a follow up appointment in one year   Providers on your designated Care Team:    Nicolasa Ducking, NP  Eula Listen, PA-C  Marisue Ivan, PA-C  Any Other Special Instructions Will Be Listed Below (If Applicable).  COVID-19 Vaccine Information can be found at: [email or web address] For questions related to vaccine distribution or appointments, please email [email or web address] or call [phone].

## 2019-11-05 NOTE — Progress Notes (Signed)
Cardiology Office Note  Date:  11/05/2019   ID:  Danny Jenkins, DOB May 15, 1980, MRN [id number]  PCP:  Galen Manila, NP (Inactive)   Chief Complaint  Patient presents with  . New Patient (Initial Visit)    Establish care with provider for irregular heartrate. Medications verbally reviewed with patient.     HPI:  Danny Jenkins is a 39 year old gentleman with no prior cardiac history History of morbid obesity weight 440 pounds Hypertension Hyperlipidemia Who presents by self-referral for irregular heartbeat  Reports having several weeks of irregular heartbeat On his apple watch, recorded PVCs coinciding with his symptoms  Ectopy typically appreciated in the daytime, feeling of being unwell Does not bother him at night, does not wake him up from sleep  In terms of his weight he has changed his diet Active with his young children Tries to avoid certain items at fast food restaurants  Previously seen in the emergency room November 2020 for chest pain Chest pain felt to be atypical in nature  Mother with palpitations, Father with premature coronary disease  EKG personally reviewed by myself on todays visit NSR rate 85 no ST or T wave changes  Total cholesterol around 260  PMH:   has a past medical history of Allergy, Anxiety, Hyperlipidemia, and Hypertension.  PSH:    Past Surgical History:  Procedure Laterality Date  . APPENDECTOMY    . EYE SURGERY  1987    Current Outpatient Medications  Medication Sig Dispense Refill  . aspirin EC 81 MG tablet Take 81 mg by mouth daily. Swallow whole.     No current facility-administered medications for this visit.     Allergies:   Sulfa antibiotics   Social History:  The patient  reports that he has quit smoking. He has never used smokeless tobacco. He reports current alcohol use. He reports that he does not use drugs.   Family History:   family history includes CAD in his father; Heart attack in his  father; Hyperlipidemia in his father and mother.    Review of Systems: Review of Systems  Constitutional: Negative.   HENT: Negative.   Respiratory: Negative.   Cardiovascular: Positive for palpitations.  Gastrointestinal: Negative.   Musculoskeletal: Negative.   Neurological: Negative.   Psychiatric/Behavioral: Negative.   All other systems reviewed and are negative.   PHYSICAL EXAM: VS:  BP (!) 128/92 (BP Location: Right Arm, Patient Position: Sitting, Cuff Size: Normal)   Pulse 85   Ht 6' (1.829 m)   Wt (!) 474 lb (215 kg)   SpO2 98%   BMI 64.29 kg/m  , BMI Body mass index is 64.29 kg/m. GEN: Well nourished, well developed, in no acute distress HEENT: normal Neck: no JVD, carotid bruits, or masses Cardiac: RRR; no murmurs, rubs, or gallops,no edema  Respiratory:  clear to auscultation bilaterally, normal work of breathing GI: soft, nontender, nondistended, + BS MS: no deformity or atrophy Skin: warm and dry, no rash Neuro:  Strength and sensation are intact Psych: euthymic mood, full affect  Recent Labs: 12/14/2018: Hemoglobin 13.5; Platelets 269 09/20/2019: BUN 13; Creatinine, Ser 0.92; Potassium 4.4; Sodium 134    Lipid Panel Lab Results  Component Value Date   CHOL 259 (H) 12/15/2016   HDL 53 12/15/2016   LDLCALC 185 (H) 12/15/2016   TRIG 94 12/15/2016    Wt Readings from Last 3 Encounters:  11/05/19 (!) 474 lb (215 kg)  09/20/19 (!) 440 lb (199.6 kg)  12/14/18 (!) 450 lb (204.1 kg)  ASSESSMENT AND PLAN:  Problem List Items Addressed This Visit    None    Visit Diagnoses    Irregular heartbeat    -  Primary   Morbid obesity (HCC)         PVC/irregular rhythm Moderate sx, PVC on apple watch Discussed a ZIO monitor if symptoms get worse Will start metoprolol 25 mg twice a day as needed for PVCs He feels he will only need a pill in the morning as he does not have symptoms in the evening -Discussed echocardiogram if symptoms persist or get  worse  Hyperlipidemia Given strong family history, elevated numbers, will start Crestor 10 mg daily with repeat lipid panel today and in 3 months time Prescription sent in Discussed potential side effects  Morbid obesity We have encouraged continued exercise, careful diet management in an effort to lose weight.   Total encounter time more than 45 minutes  Greater than 50% was spent in counseling and coordination of care with the patient    Signed, Dossie Arbour, M.D., Ph.D. [facility] [place], [place] [phone]

## 2019-11-06 LAB — HEPATIC FUNCTION PANEL
ALT: 47 IU/L — ABNORMAL HIGH (ref 0–44)
AST: 21 IU/L (ref 0–40)
Albumin: 4.6 g/dL (ref 4.0–5.0)
Alkaline Phosphatase: 80 IU/L (ref 44–121)
Bilirubin Total: 0.4 mg/dL (ref 0.0–1.2)
Bilirubin, Direct: 0.12 mg/dL (ref 0.00–0.40)
Total Protein: 7.1 g/dL (ref 6.0–8.5)

## 2019-11-06 LAB — LIPID PANEL
Chol/HDL Ratio: 5.6 ratio — ABNORMAL HIGH (ref 0.0–5.0)
Cholesterol, Total: 228 mg/dL — ABNORMAL HIGH (ref 100–199)
HDL: 41 mg/dL (ref >39)
LDL Chol Calc (NIH): 157 mg/dL — ABNORMAL HIGH (ref 0–99)
Triglycerides: 165 mg/dL — ABNORMAL HIGH (ref 0–149)
VLDL Cholesterol Cal: 30 mg/dL (ref 5–40)

## 2020-03-06 ENCOUNTER — Other Ambulatory Visit: Payer: Self-pay | Admitting: Cardiovascular Disease

## 2020-07-01 ENCOUNTER — Other Ambulatory Visit
Admission: RE | Admit: 2020-07-01 | Discharge: 2020-07-01 | Disposition: A | Discharge status: Home / Self Care | Payer: 59 | Source: Ambulatory Visit | Attending: Cardiovascular Disease | Admitting: Cardiovascular Disease

## 2020-07-01 DIAGNOSIS — I499 Cardiac arrhythmia, unspecified: Secondary | ICD-10-CM | POA: Diagnosis not present

## 2020-07-01 LAB — HEPATIC FUNCTION PANEL
ALT: 33 U/L (ref 0–44)
AST: 18 U/L (ref 15–41)
Albumin: 4.4 g/dL (ref 3.5–5.0)
Alkaline Phosphatase: 69 U/L (ref 38–126)
Bilirubin, Direct: <0.1 mg/dL (ref 0.0–0.2)
Total Bilirubin: 0.7 mg/dL (ref 0.3–1.2)
Total Protein: 7.3 g/dL (ref 6.5–8.1)

## 2020-07-01 LAB — LIPID PANEL
Cholesterol: 178 mg/dL (ref 0–200)
HDL: 50 mg/dL (ref >40)
LDL Cholesterol: 87 mg/dL (ref 0–99)
Total CHOL/HDL Ratio: 3.6 RATIO
Triglycerides: 206 mg/dL — ABNORMAL HIGH (ref <150)
VLDL: 41 mg/dL — ABNORMAL HIGH (ref 0–40)

## 2020-07-03 ENCOUNTER — Ambulatory Visit (INDEPENDENT_AMBULATORY_CARE_PROVIDER_SITE_OTHER): Payer: 59

## 2020-07-03 ENCOUNTER — Ambulatory Visit: Payer: 59 | Admitting: Physician Assistant

## 2020-07-03 ENCOUNTER — Encounter: Payer: Self-pay | Admitting: Physician Assistant

## 2020-07-03 ENCOUNTER — Other Ambulatory Visit: Payer: Self-pay

## 2020-07-03 VITALS — BP 122/88 | HR 74 | Ht 72.0 in | Wt >= 6400 oz

## 2020-07-03 DIAGNOSIS — R000 Tachycardia, unspecified: Secondary | ICD-10-CM | POA: Diagnosis not present

## 2020-07-03 DIAGNOSIS — E7800 Pure hypercholesterolemia, unspecified: Secondary | ICD-10-CM

## 2020-07-03 DIAGNOSIS — R072 Precordial pain: Secondary | ICD-10-CM

## 2020-07-03 DIAGNOSIS — R0602 Shortness of breath: Secondary | ICD-10-CM

## 2020-07-03 DIAGNOSIS — Z6841 Body Mass Index (BMI) 40.0 and over, adult: Secondary | ICD-10-CM | POA: Diagnosis not present

## 2020-07-03 DIAGNOSIS — Z87891 Personal history of nicotine dependence: Secondary | ICD-10-CM

## 2020-07-03 MED ORDER — METOPROLOL TARTRATE 100 MG PO TABS: 100.0000 mg | ORAL_TABLET | Freq: Once | ORAL | 0 refills | Status: DC

## 2020-07-03 NOTE — Patient Instructions (Signed)
Medication Instructions:  Your physician recommends that you continue on your current medications as directed. Please refer to the Current Medication list given to you today.  *If you need a refill on your cardiac medications before your next appointment, please call your pharmacy*   Lab Work:  Your physician recommends that you have lab work TODAY: BMET   Testing/Procedures:  1) Your physician has requested that you have an echocardiogram. Echocardiography is a painless test that uses sound waves to create images of your heart. It provides your doctor with information about the size and shape of your heart and how well your heart's chambers and valves are working. This procedure takes approximately one hour. There are no restrictions for this procedure.  2) Your physician has recommended that you wear a Zio monitor.   This monitor is a medical device that records the heart's electrical activity. Doctors most often use these monitors to diagnose arrhythmias. Arrhythmias are problems with the speed or rhythm of the heartbeat. The monitor is a small device applied to your chest. You can wear one while you do your normal daily activities. While wearing this monitor if you have any symptoms to push the button and record what you felt. Once you have worn this monitor for the period of time provider prescribed (Usually 14 days), you will return the monitor device in the postage paid box. Once it is returned they will download the data collected and provide [place] with a report which the provider will then review and we will call you with those results. Important tips:  1. Avoid showering during the first 24 hours of wearing the monitor. 2. Avoid excessive sweating to help maximize wear time. 3. Do not submerge the device, no hot tubs, and no swimming pools. 4. Keep any lotions or oils away from the patch. 5. After 24 hours you may shower with the patch on. Take brief showers with your back facing the  shower head.  6. Do not remove patch once it has been placed because that will interrupt data and decrease adhesive wear time. 7. Push the button when you have any symptoms and write down what you were feeling. 8. Once you have completed wearing your monitor, remove and place into box which has postage paid and place in your outgoing mailbox.  9. If for some reason you have misplaced your box then call our office and we can provide another box and/or mail it off for you.   3) Your provider has recommended that you have a cardiac CT  Your cardiac CT will be scheduled at one of the below locations:   [facility] [address] [place], [place] [address] [phone]  Please arrive at the [facility] main entrance (entrance A) of [facility] 30 minutes prior to test start time. Proceed to the [facility] Radiology Department (first floor) to check-in and test prep.   Please follow these instructions carefully (unless otherwise directed):  Hold all erectile dysfunction medications at least 3 days (72 hrs) prior to test.  On the Night Before the Test: . Be sure to Drink plenty of water. . Do not consume any caffeinated/decaffeinated beverages or chocolate 12 hours prior to your test. . Do not take any antihistamines 12 hours prior to your test.  On the Day of the Test: . Drink plenty of water until 1 hour prior to the test. . Do not eat any food 4 hours prior to the test. . You may  take your regular medications prior to the test.  . Take metoprolol (Lopressor) two hours prior to test.        After the Test: . Drink plenty of water. . After receiving IV contrast, you may experience a mild flushed feeling. This is normal. . On occasion, you may experience a mild rash up to 24 hours after the test. This is not dangerous. If this occurs, you can take Benadryl 25 mg and increase your fluid intake. . If you experience trouble breathing, this can be serious. If it is  severe call 911 IMMEDIATELY. If it is mild, please call our office. . If you take any of these medications: Glipizide/Metformin, Avandament, Glucavance, please do not take 48 hours after completing test unless otherwise instructed.   Once we have confirmed authorization from your insurance company, we will call you to set up a date and time for your test. Based on how quickly your insurance processes prior authorizations requests, please allow up to 4 weeks to be contacted for scheduling your Cardiac CT appointment. Be advised that routine Cardiac CT appointments could be scheduled as many as 8 weeks after your provider has ordered it.  For non-scheduling related questions, please contact the cardiac imaging nurse navigator should you have any questions/concerns: Rockwell Alexandria, Cardiac Imaging Nurse Navigator Larey Brick, Cardiac Imaging Nurse Navigator [facility] Direct Office Dial: [phone]   For scheduling needs, including cancellations and rescheduling, please call [place], [phone].    Follow-Up: At [facility], you and your health needs are our priority.  As part of our continuing mission to provide you with exceptional heart care, we have created designated Provider Care Teams.  These Care Teams include your primary Cardiologist (physician) and Advanced Practice Providers (APPs -  Physician Assistants and Nurse Practitioners) who all work together to provide you with the care you need, when you need it.  We recommend signing up for the patient portal called "MyChart".  Sign up information is provided on this After Visit Summary.  MyChart is used to connect with patients for Virtual Visits (Telemedicine).  Patients are able to view lab/test results, encounter notes, upcoming appointments, etc.  Non-urgent messages can be sent to your provider as well.   To learn more about what you can do with MyChart, go to [email or web address].    Your next  appointment:   4 - 6 week(s) after echo and zio monitor   The format for your next appointment:   In Person  Provider:   You may see Dr. Mariah Milling or one of the following Advanced Practice Providers on your designated Care Team:     Marisue Ivan, [place]

## 2020-07-03 NOTE — Progress Notes (Signed)
Office Visit    Patient Name: Jahari Billy Date of Encounter: 07/04/2020  PCP:  Galen Manila, NP (Inactive)   [facility]  Cardiologist: Dr. Mariah Milling Advanced Practice Provider:  No care team member to display Electrophysiologist:  None  :[id number]}   Chief Complaint    Chief Complaint  Patient presents with  . OTHER    Patient c/o weird heartbeat and chest pain with sensations down his left arm about 2 weeks ago while on vacation. Medications verbally reviewed with patient.     40 y.o. male with history of tachypalpitations, hyperlipidemia, possible familial hyperlipidemia, family history of early CAD, prior tobacco use, and here today for follow-up after recent chest pain and ongoing tachypalpitations.  Past Medical History    Past Medical History:  Diagnosis Date  . Allergy   . Anxiety   . Hyperlipidemia   . Hypertension    Past Surgical History:  Procedure Laterality Date  . APPENDECTOMY    . EYE SURGERY  1987    Allergies  Allergies  Allergen Reactions  . Sulfa Antibiotics Hives    History of Present Illness    Gamble Enderle is a 40 y.o. male with PMH as above.  He also reports a previous history of tobacco use, though he has quit for 18 years.  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  He was initially evaluated at [facility] with clinic visit 11/05/2019 with his primary cardiologist, Dr. Mariah Milling.  At that time, it was noted that he had no prior cardiac history but PMH significant for obesity, hypertension, hyperlipidemia.  Previous admissions included 12/2018 emergency room visit for chest pain, felt to be atypical in nature.    Family history included a mother with palpitations/erratic heart rate and father with premature CAD.  Today, he describes a father with 1 confirmed MI and 2 coronary stents.  He does report that his father had an unhealthy diet.   First MI was reportedly in his early 67s.  He also reports cholesterol issues/hyperlipidemia on his mother side as well as possible family history of familial hyperlipidemia with his grandmother having cholesterol in the 400s, which was noted as typical for her, despite medication.  When seen 11/05/2019, it was noted he was a self-referral for appreciation of irregular heartbeat.  At that time, he reported several weeks of irregular heartbeat, recorded on his apple watch.  He had recorded PVCs coinciding with the symptoms.  He typically appreciated ectopy in the daytime, described as a general feeling of unwellness.  His symptoms did but not bother him at night and did not wake him from sleep.  He had recently changed his diet in hopes that this would result in improvement of his weight.  He was active with his young children.  He was trying to avoid certain foods at fast food restaurants.  EKG NSR without acute ST/T changes.  Total cholesterol noted to be 260.  Recommendation was for metoprolol 25 mg twice daily as needed for PVCs.  He had felt he would only need a pill in the morning, as he did not have evening symptoms.  Echo was discussed if symptoms persisted or worsened.  He was started on Crestor 10 mg daily with recommendation for repeat lipid and liver panel in 3 months time.  Weight loss was recommended.  Today, 07/03/2020, he returns to clinic and notes ongoing symptoms of tachypalpitations.  He reports  that he has noticed that his symptoms now have increased also occurring at night, and as a result, he has had to take his metoprolol twice daily.  He reports an episode that occurred 2 weeks ago while at [place] with his nephew.  He reports that it was very hot outside, and he was trying to keep up with his nephew that was 72 years of age.  At that time, he felt his heart racing, alleviating with rest.breathing okay at that time.  On his apple watch, rates were 105 to 106 bpm, occasionally into the 110s  on his metoprolol.  He reported some associated chest discomfort.  He reports that sometimes his CP feels as if the needle is in his chest and then goes away.  He reports CP both at rest and with exertion.  CP also goes down the left arm and then goes away.  He reports 2 episodes in the last month and ongoing palpitations at night.  He is not very active, as he works from home.  No signs or symptoms of bleeding.  No signs or symptoms reported of volume overload..  Home Medications   Current Outpatient Medications  Medication Instructions  . metoprolol tartrate (LOPRESSOR) 25 MG tablet TAKE 1 TABLET(25 MG) BY MOUTH TWICE DAILY AS NEEDED  . metoprolol tartrate (LOPRESSOR) 100 mg, Oral,  Once, Take TWO hours prior to CT procedure  . rosuvastatin (CRESTOR) 10 mg, Oral, Daily     Review of Systems    He reports chest pain and tachypalpitations. He deniespnd, orthopnea, n, v, dizziness, syncope, edema, weight gain, or early satiety.    All other systems reviewed and are otherwise negative except as noted above.  Physical Exam    VS:  BP 122/88 (BP Location: Left Arm, Patient Position: Sitting, Cuff Size: Large)   Pulse 74   Ht 6' (1.829 m)   Wt (!) 482 lb (218.6 kg)   SpO2 98%   BMI 65.37 kg/m  , BMI Body mass index is 65.37 kg/m. GEN: Well nourished, well developed, in no acute distress.  Facemask in place. HEENT: normal. Neck: Supple, no JVD, carotid bruits, or masses. Cardiac: RRR, no murmurs, rubs, or gallops. No clubbing, cyanosis, edema.  Radials/DP/PT 2+ and equal bilaterally.  Respiratory:  Respirations regular and unlabored, clear to auscultation bilaterally. GI: Soft, nontender, nondistended, BS + x 4. MS: no deformity or atrophy. Skin: warm and dry, no rash. Neuro:  Strength and sensation are intact. Psych: Normal affect.  Accessory Clinical Findings    ECG personally reviewed by me today -NSR, 74 bpm- no acute changes.  VITALS Reviewed today   Temp Readings from Last 3  Encounters:  09/20/19 98.7 F (37.1 C) (Oral)  12/14/18 97.9 F (36.6 C) (Oral)  12/15/16 98.2 F (36.8 C) (Oral)   BP Readings from Last 3 Encounters:  07/03/20 122/88  11/05/19 (!) 128/92  09/20/19 140/85   Pulse Readings from Last 3 Encounters:  07/03/20 74  11/05/19 85  09/20/19 92    Wt Readings from Last 3 Encounters:  07/03/20 (!) 482 lb (218.6 kg)  11/05/19 (!) 474 lb (215 kg)  09/20/19 (!) 440 lb (199.6 kg)     LABS  reviewed today   Lab Results  Component Value Date   WBC 8.4 12/14/2018   HGB 13.5 12/14/2018   HCT 41.3 12/14/2018   MCV 79.9 (L) 12/14/2018   PLT 269 12/14/2018   Lab Results  Component Value Date   CREATININE 0.82 07/03/2020  BUN 11 07/03/2020   NA 140 07/03/2020   K 4.7 07/03/2020   CL 98 07/03/2020   CO2 21 07/03/2020   Lab Results  Component Value Date   ALT 33 07/01/2020   AST 18 07/01/2020   ALKPHOS 69 07/01/2020   BILITOT 0.7 07/01/2020   Lab Results  Component Value Date   CHOL 178 07/01/2020   HDL 50 07/01/2020   LDLCALC 87 07/01/2020   TRIG 206 (H) 07/01/2020   CHOLHDL 3.6 07/01/2020    Lab Results  Component Value Date   HGBA1C 5.4 12/15/2016   No results found for: TSH   STUDIES/PROCEDURES reviewed today   None  Assessment & Plan    Tachypalpitations, Chest pain --Reports CP with exertion and tachypalpitations. EKG today without significant ST/T changes though poor lead placement likely in lead III.  He has reported a history of PVCs on his apple watch and is quite symptomatic with this ectopy.  Previously discussed was ambulatory Zio monitoring for 2 weeks if ongoing symptoms.  Given his symptoms have progressed to occurring at night with breakthrough tachypalpitations despite taking his metoprolol now twice daily, reasonable to obtain Zio XT x2 weeks at this time.  Continue metoprolol 25 mg twice daily.  We will also obtain echo to assess EF, wall motion, and rule out any acute structural changes.  Further  recommendations at that time if indicated.  Also will obtain cardiac CTA, given his family history to include a father with MI in his 43s, and if able given national contrast shortage. He has multiple RF for CAD, including FHx, BMI, LDL, and history of tobacco use. Return precautions discussed with patient understanding.  Continue aggressive risk factor modification.  Lifestyle changes discussed, including increasing activity/diet/exercise with patient understanding.  HLD Familial hyperlipidemia --Reports strong family history of hyperlipidemia.  Started on Crestor 10 mg daily at previous clinic visit with recommendation for repeat lipid and liver function in 6 to 8 weeks.  Will obtain repeat labs today.  If needed, escalation of statin +/- Zetia+/- PCSK9 inhibitor at that time, given family history.   Medication changes: None Labs ordered: CMET, LDL  Studies / Imaging ordered: cCTA, Echo,Zio Disposition: RTC after studies  *Please be aware that the above documentation was completed voice recognition software and may contain dictation errors.     Lennon Alstrom, PA-C

## 2020-07-04 LAB — BASIC METABOLIC PANEL
BUN/Creatinine Ratio: 13 (ref 9–20)
BUN: 11 mg/dL (ref 6–24)
CO2: 21 mmol/L (ref 20–29)
Calcium: 9.3 mg/dL (ref 8.7–10.2)
Chloride: 98 mmol/L (ref 96–106)
Creatinine, Ser: 0.82 mg/dL (ref 0.76–1.27)
Glucose: 93 mg/dL (ref 65–99)
Potassium: 4.7 mmol/L (ref 3.5–5.2)
Sodium: 140 mmol/L (ref 134–144)
eGFR: 113 mL/min/{1.73_m2} (ref >59)

## 2020-07-07 ENCOUNTER — Other Ambulatory Visit: Payer: Self-pay | Admitting: Cardiovascular Disease

## 2020-07-29 ENCOUNTER — Telehealth (HOSPITAL_COMMUNITY): Payer: Self-pay | Admitting: *Deleted

## 2020-07-29 NOTE — Telephone Encounter (Signed)
Reaching out to patient to offer assistance regarding upcoming cardiac imaging study; pt verbalizes understanding of appt date/time, parking situation and where to check in, pre-test NPO status and medications ordered, and verified current allergies; name and call back number provided for further questions should they arise  Lewis Grivas RN Navigator Cardiac Imaging [facility] [phone] office [phone] cell  Pt to take 100mg metoprolol tartrate 2 hours prior to cardiac CT. 

## 2020-07-30 ENCOUNTER — Other Ambulatory Visit: Payer: Self-pay

## 2020-07-30 ENCOUNTER — Ambulatory Visit (HOSPITAL_COMMUNITY)
Admission: RE | Admit: 2020-07-30 | Discharge: 2020-07-30 | Disposition: A | Discharge status: Home / Self Care | Payer: 59 | Source: Ambulatory Visit | Attending: Physician Assistant | Admitting: Physician Assistant

## 2020-07-30 DIAGNOSIS — R072 Precordial pain: Secondary | ICD-10-CM | POA: Insufficient documentation

## 2020-07-30 MED ORDER — NITROGLYCERIN 0.4 MG SL SUBL
0.8000 mg | SUBLINGUAL_TABLET | Freq: Once | SUBLINGUAL | Status: AC
  Administered 2020-07-30: 0.8 mg via SUBLINGUAL

## 2020-07-30 MED ORDER — IOHEXOL 350 MG/ML SOLN
100.0000 mL | Freq: Once | INTRAVENOUS | Status: AC | PRN
  Administered 2020-07-30: 100 mL via INTRAVENOUS

## 2020-07-30 MED ORDER — NITROGLYCERIN 0.4 MG SL SUBL
SUBLINGUAL_TABLET | SUBLINGUAL | Status: AC
  Filled 2020-07-30: qty 2

## 2020-08-16 ENCOUNTER — Other Ambulatory Visit: Payer: Self-pay | Admitting: Cardiovascular Disease

## 2020-08-25 ENCOUNTER — Other Ambulatory Visit: Payer: Self-pay

## 2020-08-25 ENCOUNTER — Ambulatory Visit (INDEPENDENT_AMBULATORY_CARE_PROVIDER_SITE_OTHER): Payer: 59

## 2020-08-25 DIAGNOSIS — R072 Precordial pain: Secondary | ICD-10-CM | POA: Diagnosis not present

## 2020-08-25 DIAGNOSIS — R0602 Shortness of breath: Secondary | ICD-10-CM | POA: Diagnosis not present

## 2020-08-25 DIAGNOSIS — R000 Tachycardia, unspecified: Secondary | ICD-10-CM

## 2020-08-25 LAB — ECHOCARDIOGRAM COMPLETE
AR max vel: 3.21 cm2
AV Area VTI: 3.5 cm2
AV Area mean vel: 3.17 cm2
AV Mean grad: 3 mmHg
AV Peak grad: 7.6 mmHg
Ao pk vel: 1.38 m/s
Area-P 1/2: 2.92 cm2
S' Lateral: 3.3 cm

## 2020-08-25 MED ORDER — PERFLUTREN LIPID MICROSPHERE
1.0000 mL | INTRAVENOUS | Status: AC | PRN
  Administered 2020-08-25: 2 mL via INTRAVENOUS

## 2020-08-31 ENCOUNTER — Ambulatory Visit: Payer: 59 | Admitting: Physician Assistant

## 2020-08-31 NOTE — Progress Notes (Deleted)
Office Visit    Patient Name: Danny Jenkins Date of Encounter: 08/31/2020  PCP:  Galen Manila, NP (Inactive)   [facility]  Cardiologist: Dr. Mariah Milling Advanced Practice Provider:  No care team member to display Electrophysiologist:  None  :[id number]}   Chief Complaint    No chief complaint on file.   40 y.o. male with history of tachypalpitations, hyperlipidemia, possible familial hyperlipidemia, family history of early CAD, prior tobacco use, and here today for follow-up after recent chest pain and ongoing tachypalpitations.  Past Medical History    Past Medical History:  Diagnosis Date   Allergy    Anxiety    Hyperlipidemia    Hypertension    Past Surgical History:  Procedure Laterality Date   APPENDECTOMY     EYE SURGERY  1987    Allergies  Allergies  Allergen Reactions   Sulfa Antibiotics Hives    History of Present Illness    Danny Jenkins is a 40 y.o. male with PMH as above.  He also reports a previous history of tobacco use, though he has quit for 18 years.  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  He was initially evaluated at [facility] with clinic visit 11/05/2019 with his primary cardiologist, Dr. Mariah Milling.  At that time, it was noted that he had no prior cardiac history but PMH significant for obesity, hypertension, hyperlipidemia.  Previous admissions included 12/2018 emergency room visit for chest pain, felt to be atypical in nature.    Family history included a mother with palpitations/erratic heart rate and father with premature CAD.  Today, he describes a father with 1 confirmed MI and 2 coronary stents.  He does report that his father had an unhealthy diet.  First MI was reportedly in his early 49s.  He also reports cholesterol issues/hyperlipidemia on his mother side as well as possible family history of familial hyperlipidemia with his  grandmother having cholesterol in the 400s, which was noted as typical for her, despite medication.  When seen 11/05/2019, it was noted he was a self-referral for appreciation of irregular heartbeat.  At that time, he reported several weeks of irregular heartbeat, recorded on his apple watch.  He had recorded PVCs coinciding with the symptoms.  He typically appreciated ectopy in the daytime, described as a general feeling of unwellness.  His symptoms did but not bother him at night and did not wake him from sleep.  He had recently changed his diet in hopes that this would result in improvement of his weight.  He was active with his young children.  He was trying to avoid certain foods at fast food restaurants.  EKG NSR without acute ST/T changes.  Total cholesterol noted to be 260.  Recommendation was for metoprolol 25 mg twice daily as needed for PVCs.  He had felt he would only need a pill in the morning, as he did not have evening symptoms.  Echo was discussed if symptoms persisted or worsened.  He was started on Crestor 10 mg daily with recommendation for repeat lipid and liver panel in 3 months time.  Weight loss was recommended.  Today, 07/03/2020, he returns to clinic and notes ongoing symptoms of tachypalpitations.  He reports that he has noticed that his symptoms now have increased also occurring at night, and as a result, he has had to take his metoprolol twice daily.  He reports an episode that occurred  2 weeks ago while at [facility] with his nephew.  He reports that it was very hot outside, and he was trying to keep up with his nephew that was 8 years of age.  At that time, he felt his heart racing, alleviating with rest.breathing okay at that time.  On his apple watch, rates were 105 to 106 bpm, occasionally into the 110s on his metoprolol.  He reported some associated chest discomfort.  He reports that sometimes his CP feels as if the needle is in his chest and then goes away.  He reports CP both at  rest and with exertion.  CP also goes down the left arm and then goes away.  He reports 2 episodes in the last month and ongoing palpitations at night.  He is not very active, as he works from home.  No signs or symptoms of bleeding.  No signs or symptoms reported of volume overload..  Home Medications   Current Outpatient Medications  Medication Instructions   metoprolol tartrate (LOPRESSOR) 25 MG tablet TAKE 1 TABLET(25 MG) BY MOUTH TWICE DAILY AS NEEDED   metoprolol tartrate (LOPRESSOR) 100 mg, Oral,  Once, Take TWO hours prior to CT procedure   rosuvastatin (CRESTOR) 10 mg, Oral, Daily     Review of Systems    He reports chest pain and tachypalpitations. He deniespnd, orthopnea, n, v, dizziness, syncope, edema, weight gain, or early satiety.    All other systems reviewed and are otherwise negative except as noted above.  Physical Exam    VS:  There were no vitals taken for this visit. , BMI There is no height or weight on file to calculate BMI. GEN: Well nourished, well developed, in no acute distress.  Facemask in place. HEENT: normal. Neck: Supple, no JVD, carotid bruits, or masses. Cardiac: RRR, no murmurs, rubs, or gallops. No clubbing, cyanosis, edema.  Radials/DP/PT 2+ and equal bilaterally.  Respiratory:  Respirations regular and unlabored, clear to auscultation bilaterally. GI: Soft, nontender, nondistended, BS + x 4. MS: no deformity or atrophy. Skin: warm and dry, no rash. Neuro:  Strength and sensation are intact. Psych: Normal affect.  Accessory Clinical Findings    ECG personally reviewed by me today -NSR, 74 bpm- no acute changes.  VITALS Reviewed today   Temp Readings from Last 3 Encounters:  09/20/19 98.7 F (37.1 C) (Oral)  12/14/18 97.9 F (36.6 C) (Oral)  12/15/16 98.2 F (36.8 C) (Oral)   BP Readings from Last 3 Encounters:  07/30/20 (!) 138/94  07/03/20 122/88  11/05/19 (!) 128/92   Pulse Readings from Last 3 Encounters:  07/30/20 67   07/03/20 74  11/05/19 85    Wt Readings from Last 3 Encounters:  07/03/20 (!) 482 lb (218.6 kg)  11/05/19 (!) 474 lb (215 kg)  09/20/19 (!) 440 lb (199.6 kg)     LABS  reviewed today   Lab Results  Component Value Date   WBC 8.4 12/14/2018   HGB 13.5 12/14/2018   HCT 41.3 12/14/2018   MCV 79.9 (L) 12/14/2018   PLT 269 12/14/2018   Lab Results  Component Value Date   CREATININE 0.82 07/03/2020   BUN 11 07/03/2020   NA 140 07/03/2020   K 4.7 07/03/2020   CL 98 07/03/2020   CO2 21 07/03/2020   Lab Results  Component Value Date   ALT 33 07/01/2020   AST 18 07/01/2020   ALKPHOS 69 07/01/2020   BILITOT 0.7 07/01/2020   Lab Results  Component Value  Date   CHOL 178 07/01/2020   HDL 50 07/01/2020   LDLCALC 87 07/01/2020   TRIG 206 (H) 07/01/2020   CHOLHDL 3.6 07/01/2020    Lab Results  Component Value Date   HGBA1C 5.4 12/15/2016   No results found for: TSH   STUDIES/PROCEDURES reviewed today   None  Assessment & Plan    Tachypalpitations, Chest pain --Reports CP with exertion and tachypalpitations. EKG today without significant ST/T changes though poor lead placement likely in lead III.  He has reported a history of PVCs on his apple watch and is quite symptomatic with this ectopy.  Previously discussed was ambulatory Zio monitoring for 2 weeks if ongoing symptoms.  Given his symptoms have progressed to occurring at night with breakthrough tachypalpitations despite taking his metoprolol now twice daily, reasonable to obtain Zio XT x2 weeks at this time.  Continue metoprolol 25 mg twice daily.  We will also obtain echo to assess EF, wall motion, and rule out any acute structural changes.  Further recommendations at that time if indicated.  Also will obtain cardiac CTA, given his family history to include a father with MI in his 58s, and if able given national contrast shortage. He has multiple RF for CAD, including FHx, BMI, LDL, and history of tobacco use. Return  precautions discussed with patient understanding.  Continue aggressive risk factor modification.  Lifestyle changes discussed, including increasing activity/diet/exercise with patient understanding.  HLD Familial hyperlipidemia --Reports strong family history of hyperlipidemia.  Started on Crestor 10 mg daily at previous clinic visit with recommendation for repeat lipid and liver function in 6 to 8 weeks.  Will obtain repeat labs today.  If needed, escalation of statin +/- Zetia+/- PCSK9 inhibitor at that time, given family history.   Medication changes: None Labs ordered: CMET, LDL  Studies / Imaging ordered: cCTA, Echo,Zio Disposition: RTC after studies  *Please be aware that the above documentation was completed voice recognition software and may contain dictation errors.     Lennon Alstrom, PA-C

## 2020-09-01 ENCOUNTER — Encounter: Payer: Self-pay | Admitting: Physician Assistant

## 2020-09-02 ENCOUNTER — Telehealth: Payer: Self-pay

## 2020-09-02 NOTE — Telephone Encounter (Signed)
Called patient to give him the following Echo results:  Echo shows --Normal pump function or heart squeeze. --Walls of the heart are moving normally. --Mildly increased thickness of the heart wall, which can happen over time withelevated blood pressure.     Overall, this is a very reassuring echo. Recommend goal BP 130/80 or lower.

## 2020-09-04 ENCOUNTER — Telehealth: Payer: Self-pay | Admitting: *Deleted

## 2020-09-04 MED ORDER — METOPROLOL TARTRATE 25 MG PO TABS: 25.0000 mg | ORAL_TABLET | Freq: Two times a day (BID) | ORAL | 3 refills | Status: DC | PRN

## 2020-09-04 MED ORDER — ROSUVASTATIN CALCIUM 10 MG PO TABS: 10.0000 mg | ORAL_TABLET | Freq: Every day | ORAL | 3 refills | Status: DC

## 2020-09-04 NOTE — Telephone Encounter (Signed)
-----   Message from Lennon Alstrom, PA-C sent at 09/01/2020  1:34 PM EDT ----- Echo shows --Normal pump function or heart squeeze. --Walls of the heart are moving normally. --Mildly increased thickness of the heart wall, which can happen over time with elevated blood pressure.    Overall, this is a very reassuring echo. Recommend goal BP 130/80 or lower.

## 2020-09-04 NOTE — Telephone Encounter (Signed)
The patient has been notified of the result and verbalized understanding.  All questions (if any) were answered. Please see subsequent phone encounter regarding result note.

## 2020-09-04 NOTE — Telephone Encounter (Signed)
Spoke to pt. Notified of echo results and provider's recc.  Pt voiced understanding.  Pt states that he has changed pharmacies to [facility] in [place] and asks that we send his cardiac meds there.  Confirmed that I have sent Metoprolol Tartrate and Rosuvastatin to [facility] in [place].  Pt has no further needs at this time.

## 2020-10-01 ENCOUNTER — Ambulatory Visit
Admission: EM | Admit: 2020-10-01 | Discharge: 2020-10-01 | Disposition: A | Discharge status: Home / Self Care | Payer: 59 | Attending: Sports Medicine | Admitting: Sports Medicine

## 2020-10-01 ENCOUNTER — Encounter: Payer: Self-pay | Admitting: Emergency Medicine

## 2020-10-01 ENCOUNTER — Other Ambulatory Visit: Payer: Self-pay

## 2020-10-01 DIAGNOSIS — R42 Dizziness and giddiness: Secondary | ICD-10-CM

## 2020-10-01 DIAGNOSIS — R519 Headache, unspecified: Secondary | ICD-10-CM | POA: Diagnosis not present

## 2020-10-01 DIAGNOSIS — R202 Paresthesia of skin: Secondary | ICD-10-CM | POA: Diagnosis not present

## 2020-10-01 NOTE — ED Provider Notes (Signed)
[facility]    CSN: [id number] Arrival date & time: 10/01/20  1629      History   Chief Complaint Chief Complaint  Patient presents with   Headache   Dizziness   Numbness    HPI Danny Jenkins is a 40 y.o. male.   40 year old male who presents for evaluation of headache, numbness, and dizziness.  Normally sees [facility] in [place].  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  Of note, he recently had a full cardiac work-up with a cardiologist and he reports that he had some mild hypertrophy of what sounds like the atrium and the recommendation is to do a sleep study.  He has not had that study yet.  He reports he had a headache at the top of his head yesterday and he took some ibuprofen which did help with symptoms.  Overnight he had increased urination but no pain on urination or hematuria.  That was out of the normal for him as he did have to get up in the middle of the night.  That has totally resolved though.  When he woke this morning he had some fullness in the posterior aspect of his head and felt like the room was spinning.  He went back to bed and lay down for an hour and the dizziness got better but he said he was "a little bit still sluggish".  Throughout the morning he started to develop some "lightning zaps" that were in different parts of his head that lasted about 3 to 5 seconds.  He denies any vision changes, painful vision, blurry vision, or double vision.  He denies any diaphoresis or intermittent claudication.  No fever shakes chills.  No nausea vomiting or diarrhea.  No abdominal or urinary symptoms currently.  About an hour prior to arrival he started getting some left-sided numbness and tingling in his left cheek.  He says it felt like someone had given him a shot of Novocain like when he is at the dentist.  That lasted for about 30 to 45 minutes but has completely resolved.  He presents now to the urgent care and is currently totally asymptomatic.  No chest pain  or shortness of breath.  No jaw pain.  He does have hypertension and hypercholesterolemia and only takes 2 medicines for these medical conditions.  Of note, he is morbidly obese with a BMI greater than 60.   Past Medical History:  Diagnosis Date   Allergy    Anxiety    Hyperlipidemia    Hypertension     Patient Active Problem List   Diagnosis Date Noted   Hyperlipidemia 12/28/2016   Class 3 severe obesity due to excess calories with serious comorbidity and body mass index (BMI) of 60.0 to 69.9 in adult [facility]) 12/28/2016    Past Surgical History:  Procedure Laterality Date   APPENDECTOMY     EYE SURGERY  1987       Home Medications    Prior to Admission medications   Medication Sig Start Date End Date Taking? Authorizing Provider  metoprolol tartrate (LOPRESSOR) 25 MG tablet Take 1 tablet (25 mg total) by mouth 2 (two) times daily as needed. 09/04/20   Marisue Ivan D, PA-C  rosuvastatin (CRESTOR) 10 MG tablet Take 1 tablet (10 mg total) by mouth daily. 09/04/20   Marisue Ivan D, PA-C  atorvastatin (LIPITOR) 20 MG tablet Take 1 tablet (20 mg total) daily by mouth. 12/16/16 09/20/19  Galen Manila, NP  Family History Family History  Problem Relation Age of Onset   Hyperlipidemia Mother    Heart attack Father    CAD Father        2 stents   Hyperlipidemia Father    Prostate cancer Neg Hx    Bladder Cancer Neg Hx    Kidney cancer Neg Hx     Social History Social History   Tobacco Use   Smoking status: Former   Smokeless tobacco: Never  Building services engineerVaping Use   Vaping Use: Never used  Substance Use Topics   Alcohol use: Yes    Comment: rarely   Drug use: No     Allergies   Sulfa antibiotics   Review of Systems Review of Systems  Constitutional:  Negative for activity change, appetite change, chills, diaphoresis, fatigue and fever.  HENT:  Negative for congestion, ear discharge, ear pain, postnasal drip, rhinorrhea, sinus pressure, sinus pain,  sneezing and sore throat.   Eyes:  Negative for pain.  Respiratory:  Negative for cough, chest tightness, shortness of breath and wheezing.   Cardiovascular:  Negative for chest pain and palpitations.  Gastrointestinal:  Negative for abdominal pain, diarrhea, nausea and vomiting.  Genitourinary:  Negative for dysuria.  Musculoskeletal:  Negative for back pain, myalgias and neck pain.  Skin:  Negative for color change, pallor, rash and wound.  Neurological:  Positive for dizziness, numbness and headaches. Negative for seizures, syncope, facial asymmetry, weakness and light-headedness.  All other systems reviewed and are negative.   Physical Exam Triage Vital Signs ED Triage Vitals  Enc Vitals Group     BP 10/01/20 1644 (!) 127/91     Pulse Rate 10/01/20 1644 73     Resp 10/01/20 1644 20     Temp 10/01/20 1644 98.4 F (36.9 C)     Temp Source 10/01/20 1644 Oral     SpO2 10/01/20 1644 99 %     Weight --      Height --      Head Circumference --      Peak Flow --      Pain Score 10/01/20 1642 0     Pain Loc --      Pain Edu? --      Excl. in GC? --    No data found.  Updated Vital Signs BP (!) 127/91 (BP Location: Left Arm)   Pulse 73   Temp 98.4 F (36.9 C) (Oral)   Resp 20   SpO2 99%   Visual Acuity Right Eye Distance:   Left Eye Distance:   Bilateral Distance:    Right Eye Near:   Left Eye Near:    Bilateral Near:     Physical Exam Vitals and nursing note reviewed.  Constitutional:      General: He is not in acute distress.    Appearance: Normal appearance. He is well-developed. He is obese. He is not ill-appearing, toxic-appearing or diaphoretic.  HENT:     Head: Normocephalic and atraumatic.     Nose: Nose normal.     Mouth/Throat:     Mouth: Mucous membranes are moist.     Pharynx: Oropharynx is clear.  Eyes:     General: No scleral icterus.    Extraocular Movements: Extraocular movements intact.     Right eye: Normal extraocular motion and no  nystagmus.     Left eye: Normal extraocular motion and no nystagmus.     Conjunctiva/sclera: Conjunctivae normal.     Pupils: Pupils are equal, round,  and reactive to light. Pupils are equal.     Right eye: Pupil is round and reactive.     Left eye: Pupil is round and reactive.  Neck:     Meningeal: Brudzinski's sign and Kernig's sign absent.  Cardiovascular:     Rate and Rhythm: Normal rate and regular rhythm.     Pulses: Normal pulses.     Heart sounds: Normal heart sounds. No murmur heard.   No friction rub. No gallop.  Pulmonary:     Effort: Pulmonary effort is normal.     Breath sounds: Normal breath sounds. No stridor. No wheezing, rhonchi or rales.  Musculoskeletal:     Cervical back: Normal range of motion and neck supple. No rigidity.  Lymphadenopathy:     Cervical: No cervical adenopathy.  Skin:    General: Skin is warm and dry.     Capillary Refill: Capillary refill takes less than 2 seconds.     Findings: No rash.  Neurological:     General: No focal deficit present.     Mental Status: He is alert and oriented to person, place, and time.     GCS: GCS eye subscore is 4. GCS verbal subscore is 5. GCS motor subscore is 6.     Cranial Nerves: Cranial nerves are intact.     Sensory: Sensation is intact.     Motor: Motor function is intact.     Coordination: Coordination is intact.     UC Treatments / Results  Labs (all labs ordered are listed, but only abnormal results are displayed) Labs Reviewed - No data to display  EKG   Radiology No results found.  Procedures Procedures (including critical care time)  Medications Ordered in UC Medications - No data to display  Initial Impression / Assessment and Plan / UC Course  I have reviewed the triage vital signs and the nursing notes.  Pertinent labs & imaging results that were available during my care of the patient were reviewed by me and considered in my medical decision making (see chart for  details).  Clinical impression: 1.  Dizziness, resolved, currently asymptomatic 2.  Paresthesias, currently resolved, currently asymptomatic 3.  Episodic headache, currently resolved 4.  Reassuring vital signs and physical exam  Treatment plan: 1.  The findings and treatment plan were discussed in detail with the patient.  Patient was in agreement. 2.  We did have a long discussion regarding his symptoms and they are very nondescript.  I did indicate what my concern was and that he may be having symptoms of a TIA.  Given his exam and vital signs are reassuring I felt that he was stable to go home with a low threshold to seek out care in an emergency room and call 911 if his symptoms did return.  He voiced verbal understanding and assured me that he would do so. 3.  Educational handouts provided. 4.  Supportive care, monitor symptoms, and over-the-counter meds as needed. 5.  Contact his primary care provider tomorrow for appropriate follow-up after the urgent care visit. 6.  Patient was stable upon discharge and will follow-up here as needed.    Final Clinical Impressions(s) / UC Diagnoses   Final diagnoses:  Dizziness  Paresthesias  Nonintractable episodic headache, unspecified headache type     Discharge Instructions      As we discussed, you have a lot of nondescript symptoms.  On a good note they have totally resolved and you are currently asymptomatic.  Your vital  signs are reassuring.  Your physical exam is extremely reassuring. That said, if your symptoms were to recur then I strongly recommend that you call 911 and go to your nearest emergency room to have a higher level of care, labs, and advanced imaging that I cannot offer you in the urgent care at this time. Please see educational handouts. If your symptoms were to come and go and persist then you can see your primary care provider.  I do not believe you are having vertigo that would require medication.  It does not fit  with all of your symptoms so I am not prescribing anything.  I am hopeful that your symptoms will resolve, but if they do not or they worsen in any way then please go to the ER.     ED Prescriptions   None    PDMP not reviewed this encounter.   Delton See, MD 10/01/20 [phone]

## 2020-10-01 NOTE — Discharge Instructions (Addendum)
As we discussed, you have a lot of nondescript symptoms.  On a good note they have totally resolved and you are currently asymptomatic.  Your vital signs are reassuring.  Your physical exam is extremely reassuring. That said, if your symptoms were to recur then I strongly recommend that you call 911 and go to your nearest emergency room to have a higher level of care, labs, and advanced imaging that I cannot offer you in the urgent care at this time. Please see educational handouts. If your symptoms were to come and go and persist then you can see your primary care provider.  I do not believe you are having vertigo that would require medication.  It does not fit with all of your symptoms so I am not prescribing anything.  I am hopeful that your symptoms will resolve, but if they do not or they worsen in any way then please go to the ER.

## 2020-10-01 NOTE — ED Triage Notes (Signed)
Pt presents today with concern with a headache that began yesterday, he took Ibuprofen and it went away. Today he was dizzy when he woke up "room spinning" with intermittent "lighten shock" headaches. This afternoon he noticed the numbness to left side of cheek "described as feeling of Novacaine" approx 40 min ago.

## 2020-11-10 IMAGING — US US EXTREM LOW VENOUS*R*
1 series · 15 of 24 positions shown · non-contrast
Comparison: None.

CLINICAL DATA: Right leg pain, cramping

EXAM:
RIGHT LOWER EXTREMITY VENOUS DOPPLER ULTRASOUND
TECHNIQUE: Gray-scale sonography with compression, as well as color and duplex
ultrasound, were performed to evaluate the deep venous system(s)
from the level of the common femoral vein through the popliteal and
proximal calf veins.

[Series 1: us extrem low venous*right* · 32 acquisitions, 15 frames shown]
[im 1/32]
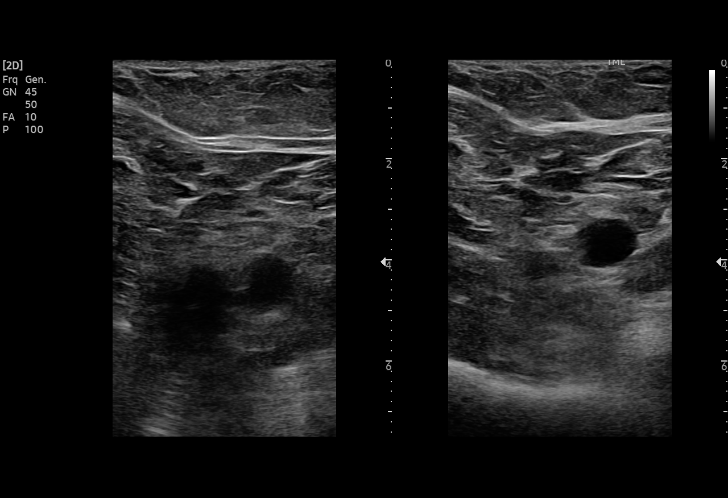
[im 3/32]
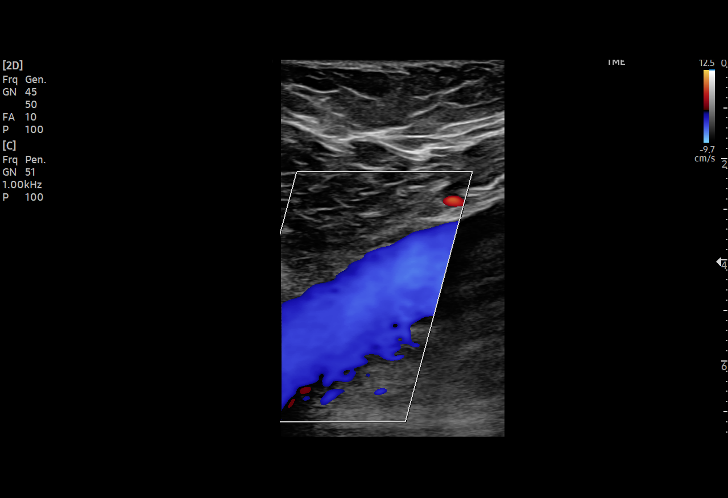
[im 6/32]
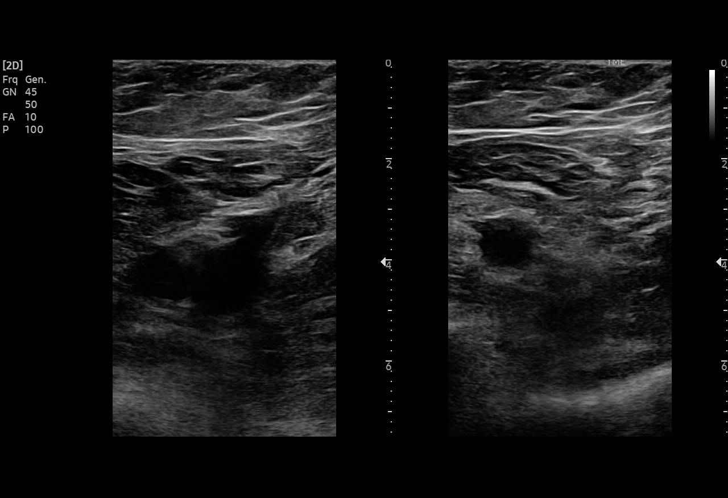
[im 7/32]
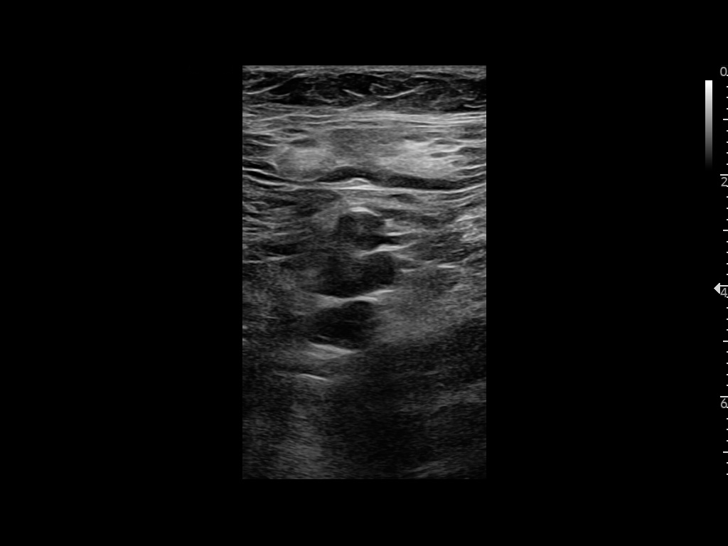
[im 9/32]
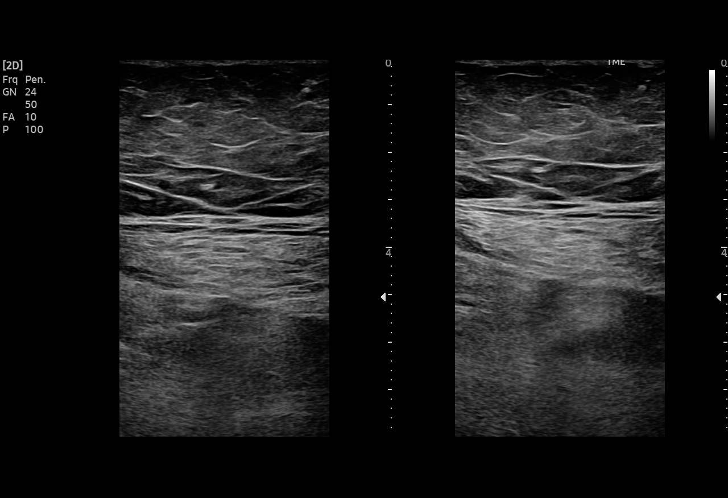
[im 10/32]
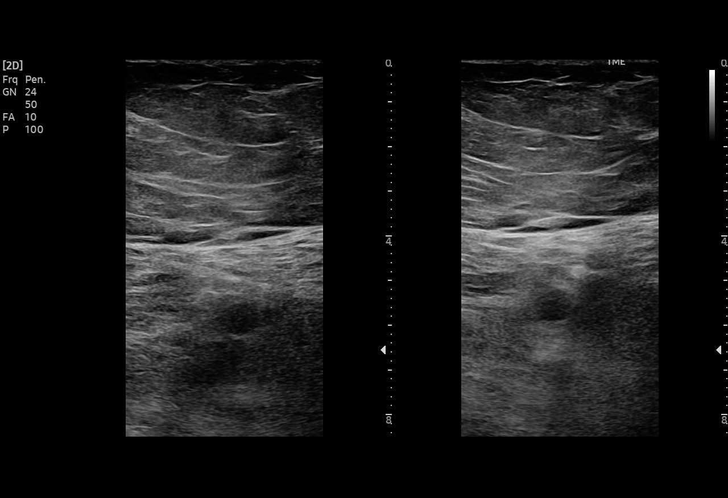
[im 13/32]
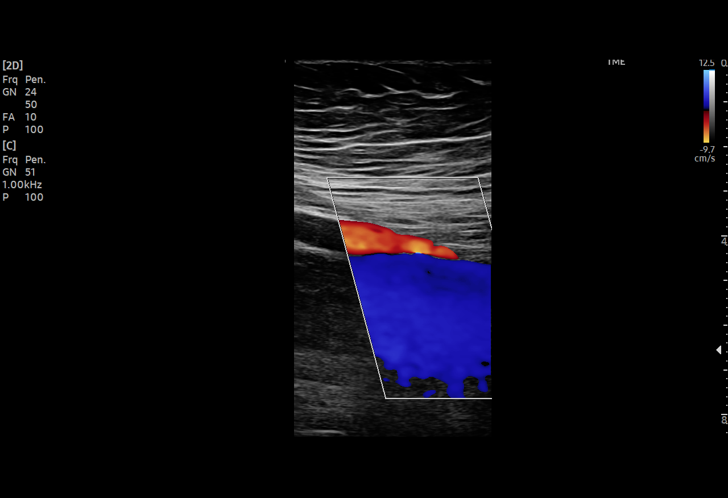
[im 17/32]
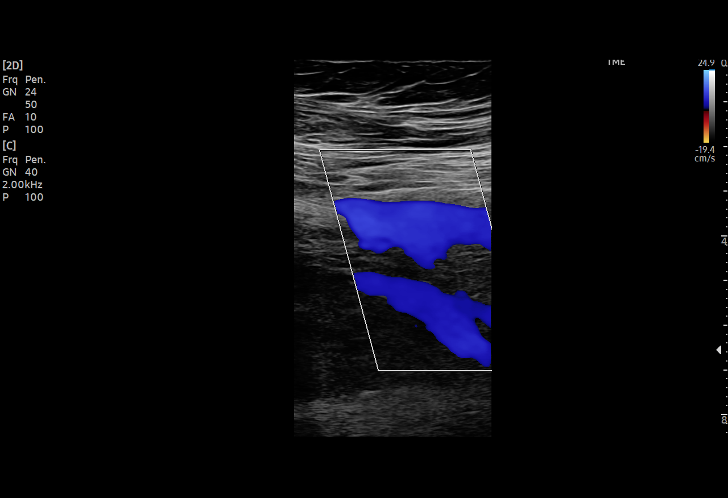
[im 18/32]
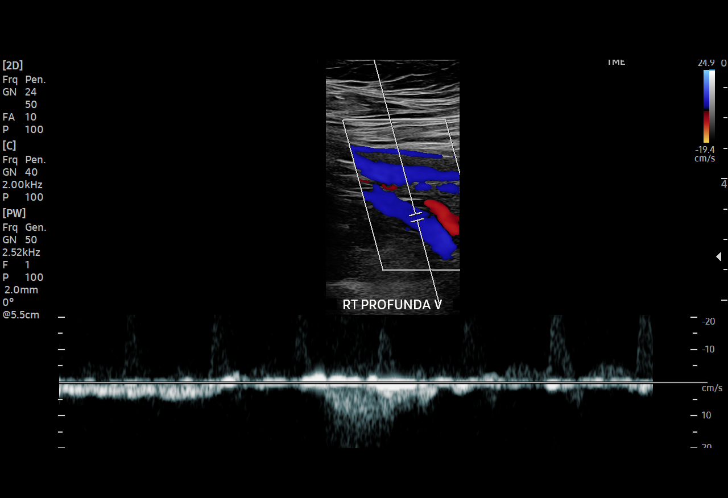
[im 21/32]
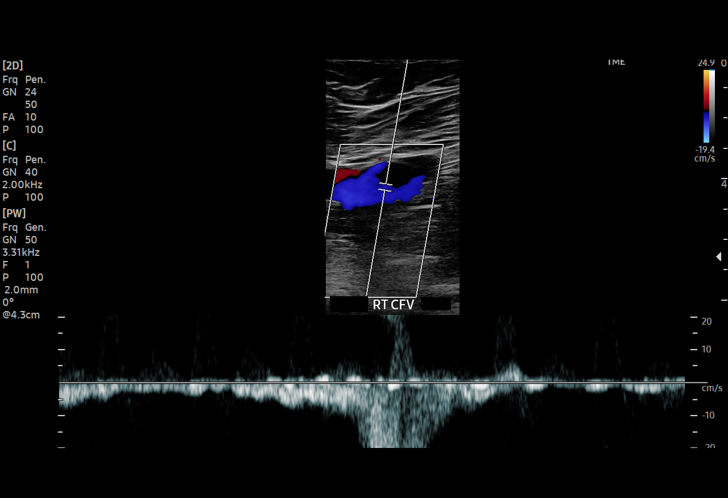
[im 22/32]
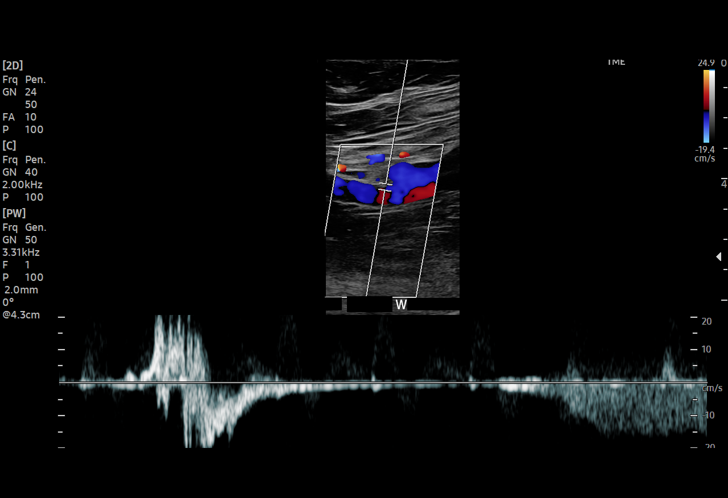
[im 25/32]
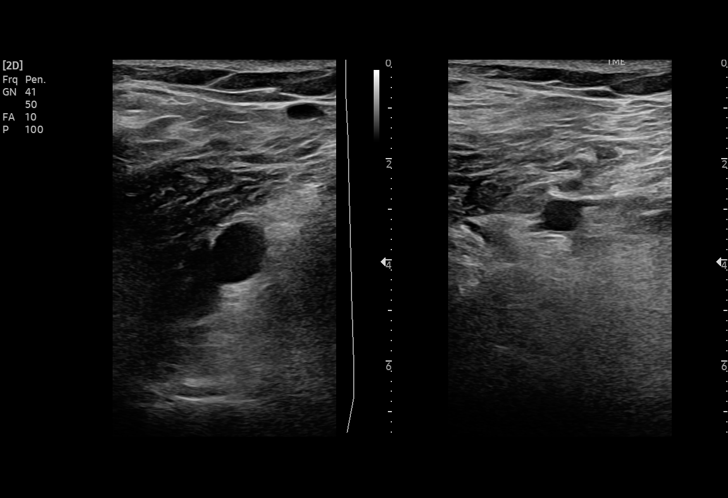
[im 27/32]
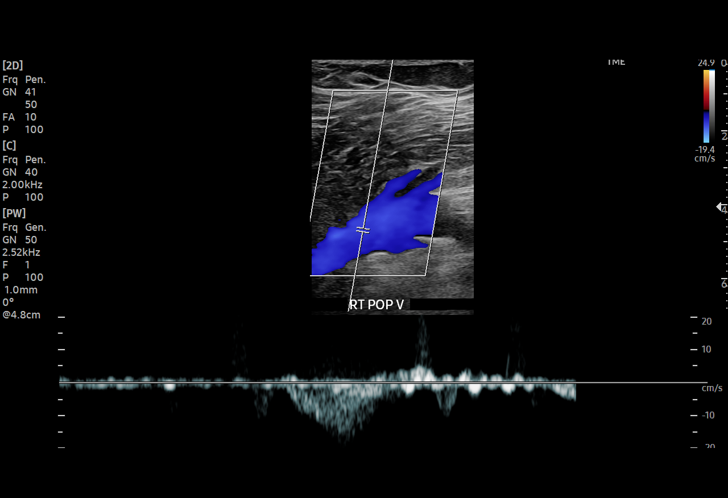
[im 29/32]
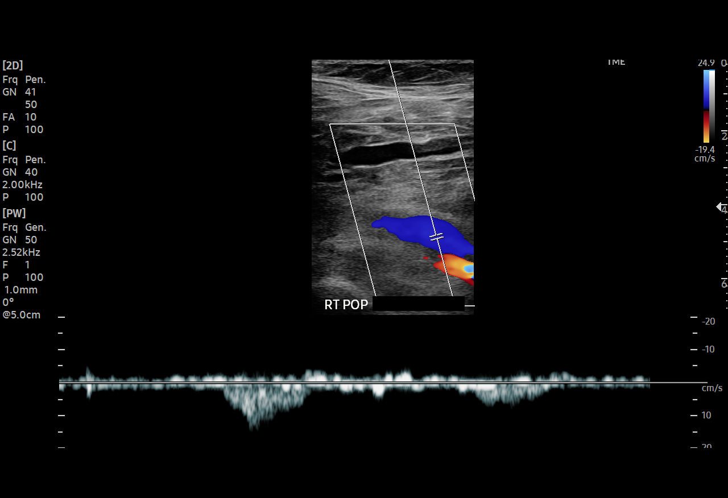
[im 32/32]
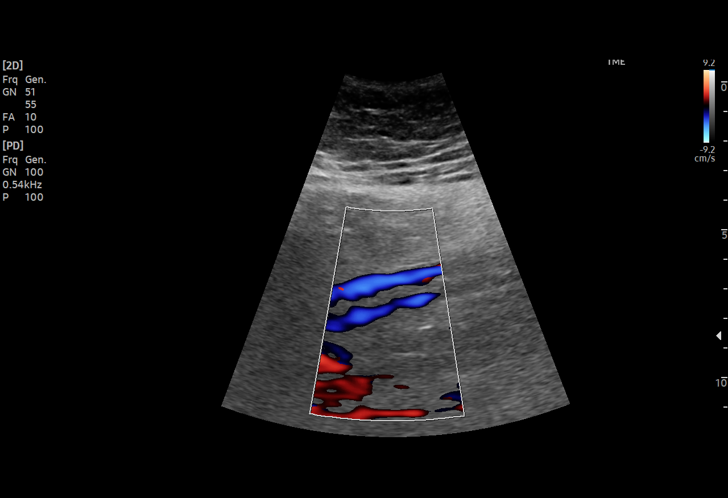

[15 of 24 positions shown; findings below may reference images not displayed]

FINDINGS: VENOUS

Normal compressibility of the common femoral, superficial femoral,
and popliteal veins, as well as the visualized calf veins.
Visualized portions of profunda femoral vein and great saphenous
vein unremarkable. No filling defects to suggest DVT on grayscale or
color Doppler imaging. Doppler waveforms show normal direction of
venous flow, normal respiratory plasticity and response to
augmentation.

Limited views of the contralateral common femoral vein are
unremarkable.

OTHER

None.

Limitations: none
IMPRESSION: Negative.

## 2021-03-16 ENCOUNTER — Other Ambulatory Visit: Payer: Self-pay

## 2021-03-16 MED ORDER — METOPROLOL TARTRATE 25 MG PO TABS: 25.0000 mg | ORAL_TABLET | Freq: Two times a day (BID) | ORAL | 0 refills | Status: DC | PRN

## 2021-05-07 ENCOUNTER — Other Ambulatory Visit: Payer: Self-pay | Admitting: Cardiovascular Disease

## 2021-05-10 MED ORDER — METOPROLOL TARTRATE 25 MG PO TABS: 25.0000 mg | ORAL_TABLET | Freq: Two times a day (BID) | ORAL | 0 refills | Status: DC | PRN

## 2021-05-30 ENCOUNTER — Other Ambulatory Visit: Payer: Self-pay | Admitting: Cardiovascular Disease

## 2021-06-09 ENCOUNTER — Other Ambulatory Visit: Payer: Self-pay | Admitting: *Deleted

## 2021-06-09 MED ORDER — METOPROLOL TARTRATE 25 MG PO TABS: ORAL_TABLET | ORAL | 0 refills | Status: DC

## 2021-06-09 MED ORDER — ROSUVASTATIN CALCIUM 10 MG PO TABS: 10.0000 mg | ORAL_TABLET | Freq: Every day | ORAL | 0 refills | Status: DC

## 2021-06-09 NOTE — Progress Notes (Deleted)
Cardiology Office Note  Date:  06/09/2021   ID:  Danny Jenkins, DOB 10-19-1980, MRN [id number]  PCP:  Danny Manila, NP (Inactive)   No chief complaint on file.   HPI:  Danny Jenkins is a 41 year old gentleman with no prior cardiac history History of morbid obesity weight 440 pounds Hypertension Hyperlipidemia Who presents by self-referral for irregular heartbeat  Reports having several weeks of irregular heartbeat On his apple watch, recorded PVCs coinciding with his symptoms  Ectopy typically appreciated in the daytime, feeling of being unwell Does not bother him at night, does not wake him up from sleep  In terms of his weight he has changed his diet Active with his young children Tries to avoid certain items at fast food restaurants  Previously seen in the emergency room November 2020 for chest pain Chest pain felt to be atypical in nature  Mother with palpitations, Father with premature coronary disease  EKG personally reviewed by myself on todays visit NSR rate 85 no ST or T wave changes  Total cholesterol around 260  PMH:   has a past medical history of Allergy, Anxiety, Hyperlipidemia, and Hypertension.  PSH:    Past Surgical History:  Procedure Laterality Date   APPENDECTOMY     EYE SURGERY  1987    Current Outpatient Medications  Medication Sig Dispense Refill   metoprolol tartrate (LOPRESSOR) 25 MG tablet TAKE 1 TABLET BY MOUTH 2 TIMES DAILY AS NEEDED. 60 tablet 0   rosuvastatin (CRESTOR) 10 MG tablet Take 1 tablet (10 mg total) by mouth daily. 90 tablet 3   No current facility-administered medications for this visit.     Allergies:   Sulfa antibiotics   Social History:  The patient  reports that he has quit smoking. He has never used smokeless tobacco. He reports current alcohol use. He reports that he does not use drugs.   Family History:   family history includes CAD in his father; Heart attack in his father; Hyperlipidemia in  his father and mother.    Review of Systems: Review of Systems  Constitutional: Negative.   HENT: Negative.    Respiratory: Negative.    Cardiovascular:  Positive for palpitations.  Gastrointestinal: Negative.   Musculoskeletal: Negative.   Neurological: Negative.   Psychiatric/Behavioral: Negative.    All other systems reviewed and are negative.  PHYSICAL EXAM: VS:  There were no vitals taken for this visit. , BMI There is no height or weight on file to calculate BMI. GEN: Well nourished, well developed, in no acute distress HEENT: normal Neck: no JVD, carotid bruits, or masses Cardiac: RRR; no murmurs, rubs, or gallops,no edema  Respiratory:  clear to auscultation bilaterally, normal work of breathing GI: soft, nontender, nondistended, + BS MS: no deformity or atrophy Skin: warm and dry, no rash Neuro:  Strength and sensation are intact Psych: euthymic mood, full affect  Recent Labs: 07/01/2020: ALT 33 07/03/2020: BUN 11; Creatinine, Ser 0.82; Potassium 4.7; Sodium 140    Lipid Panel Lab Results  Component Value Date   CHOL 178 07/01/2020   HDL 50 07/01/2020   LDLCALC 87 07/01/2020   TRIG 206 (H) 07/01/2020    Wt Readings from Last 3 Encounters:  07/03/20 (!) 482 lb (218.6 kg)  11/05/19 (!) 474 lb (215 kg)  09/20/19 (!) 440 lb (199.6 kg)     ASSESSMENT AND PLAN:  Problem List Items Addressed This Visit   None PVC/irregular rhythm Moderate sx, PVC on apple watch Discussed a ZIO monitor  if symptoms get worse Will start metoprolol 25 mg twice a day as needed for PVCs He feels he will only need a pill in the morning as he does not have symptoms in the evening -Discussed echocardiogram if symptoms persist or get worse  Hyperlipidemia Given strong family history, elevated numbers, will start Crestor 10 mg daily with repeat lipid panel today and in 3 months time Prescription sent in Discussed potential side effects  Morbid obesity We have encouraged continued  exercise, careful diet management in an effort to lose weight.   Total encounter time more than 45 minutes  Greater than 50% was spent in counseling and coordination of care with the patient    Signed, Dossie Arbour, M.D., Ph.D. [facility] [place], [place] [phone]

## 2021-06-11 ENCOUNTER — Ambulatory Visit: Payer: 59 | Admitting: Cardiovascular Disease

## 2021-06-27 ENCOUNTER — Other Ambulatory Visit: Payer: Self-pay | Admitting: Cardiovascular Disease

## 2021-07-21 NOTE — Progress Notes (Signed)
Cardiology Office Note  Date:  07/23/2021   ID:  Danny Jenkins, DOB 1980-03-10, MRN [id number]  PCP:  Danny Manila, NP (Inactive)   Chief Complaint  Patient presents with   4-6 week follow up     "Doing well." Medications reviewed by the patient verbally.     HPI:  Danny Jenkins is a 41 year old gentleman with no prior cardiac history History of morbid obesity weight 440 pounds Hypertension Hyperlipidemia Who presents by self-referral for irregular heartbeat  LOV 9/21 In follow-up today reports he is doing well Reports he is active, Tolerating Crestor 10 mg daily Lab work reviewed Total chol 259 down to 178 on crestor 10 daily  Takes metoprolol tartrate 25 BID, no arrhythmia RAre episodes on metoprolol  Previously reported noting PVCs on his Apple Watch Reports that he has snoring, never been diagnosed with sleep apnea  Try to watch his diet, difficulty losing weight  EKG personally reviewed by myself on todays visit Normal sinus rhythm rate 77 bpm no significant ST-T wave changes  Other past medical history reviewed Previously seen in the emergency room November 2020 for chest pain Chest pain felt to be atypical in nature  Mother with palpitations, Father with premature coronary disease, with stent x 2  PMH:   has a past medical history of Allergy, Anxiety, Hyperlipidemia, and Hypertension.  PSH:    Past Surgical History:  Procedure Laterality Date   APPENDECTOMY     EYE SURGERY  1987    Current Outpatient Medications  Medication Sig Dispense Refill   metoprolol tartrate (LOPRESSOR) 25 MG tablet TAKE 1 TABLET BY MOUTH TWICE A DAY AS NEEDED 60 tablet 0   rosuvastatin (CRESTOR) 10 MG tablet Take 1 tablet (10 mg total) by mouth daily. 30 tablet 0   No current facility-administered medications for this visit.    Allergies:   Sulfa antibiotics   Social History:  The patient  reports that he has quit smoking. He has never used smokeless  tobacco. He reports current alcohol use. He reports that he does not use drugs.   Family History:   family history includes CAD in his father; Heart attack in his father; Hyperlipidemia in his father and mother.    Review of Systems: Review of Systems  Constitutional: Negative.   HENT: Negative.    Respiratory: Negative.    Cardiovascular: Negative.   Gastrointestinal: Negative.   Musculoskeletal: Negative.   Neurological: Negative.   Psychiatric/Behavioral: Negative.    All other systems reviewed and are negative.   PHYSICAL EXAM: VS:  BP 130/80 (BP Location: Left Wrist, Patient Position: Sitting, Cuff Size: Normal)   Pulse 73   Ht 6' (1.829 m)   Wt (!) 490 lb (222.3 kg)   SpO2 98%   BMI 66.46 kg/m  , BMI Body mass index is 66.46 kg/m. Constitutional:  oriented to person, place, and time. No distress.  HENT:  Head: Grossly normal Eyes:  no discharge. No scleral icterus.  Neck: No JVD, no carotid bruits  Cardiovascular: Regular rate and rhythm, no murmurs appreciated Pulmonary/Chest: Clear to auscultation bilaterally, no wheezes or rails Abdominal: Soft.  no distension.  no tenderness.  Musculoskeletal: Normal range of motion Neurological:  normal muscle tone. Coordination normal. No atrophy Skin: Skin warm and dry Psychiatric: normal affect, pleasant  Recent Labs: No results found for requested labs within last 365 days.    Lipid Panel Lab Results  Component Value Date   CHOL 178 07/01/2020   HDL 50 07/01/2020  LDLCALC 87 07/01/2020   TRIG 206 (H) 07/01/2020    Wt Readings from Last 3 Encounters:  07/23/21 (!) 490 lb (222.3 kg)  07/03/20 (!) 482 lb (218.6 kg)  11/05/19 (!) 474 lb (215 kg)     ASSESSMENT AND PLAN:  Problem List Items Addressed This Visit       Cardiology Problems   Hyperlipidemia   Other Visit Diagnoses     Shortness of breath    -  Primary   Racing heart beat       Precordial pain       BMI 60.0-69.9, adult (HCC)        History of tobacco use          PVC/irregular rhythm Symptoms relatively well controlled on beta-blocker metoprolol tartrate 25 twice daily We will try to simplify regiment, changed to metoprolol succinate 50 daily No further work-up needed at this time  Hyperlipidemia Cardiac CTA results reviewed, nonobstructive coronary disease noted Recommend he increase Crestor up to 20 mg daily Potentially could add Zetia in the future, goal LDL less than 70  Morbid obesity We have encouraged continued exercise, careful diet management in an effort to lose weight.    Total encounter time more than 30 minutes  Greater than 50% was spent in counseling and coordination of care with the patient    Signed, Dossie Arbour, M.D., Ph.D. [facility] [place], [place] [phone]

## 2021-07-23 ENCOUNTER — Encounter: Payer: Self-pay | Admitting: Cardiovascular Disease

## 2021-07-23 ENCOUNTER — Ambulatory Visit: Payer: 59 | Admitting: Cardiovascular Disease

## 2021-07-23 VITALS — BP 130/80 | HR 73 | Ht 72.0 in | Wt >= 6400 oz

## 2021-07-23 DIAGNOSIS — R000 Tachycardia, unspecified: Secondary | ICD-10-CM | POA: Diagnosis not present

## 2021-07-23 DIAGNOSIS — Z87891 Personal history of nicotine dependence: Secondary | ICD-10-CM

## 2021-07-23 DIAGNOSIS — R072 Precordial pain: Secondary | ICD-10-CM | POA: Diagnosis not present

## 2021-07-23 DIAGNOSIS — R0602 Shortness of breath: Secondary | ICD-10-CM | POA: Diagnosis not present

## 2021-07-23 DIAGNOSIS — Z6841 Body Mass Index (BMI) 40.0 and over, adult: Secondary | ICD-10-CM | POA: Diagnosis not present

## 2021-07-23 DIAGNOSIS — E7800 Pure hypercholesterolemia, unspecified: Secondary | ICD-10-CM

## 2021-07-23 MED ORDER — METOPROLOL TARTRATE 25 MG PO TABS: ORAL_TABLET | ORAL | 0 refills | Status: DC

## 2021-07-23 MED ORDER — ROSUVASTATIN CALCIUM 10 MG PO TABS: 10.0000 mg | ORAL_TABLET | Freq: Every day | ORAL | 0 refills | Status: DC

## 2021-07-23 NOTE — Patient Instructions (Addendum)
Medication Instructions:  Please increase the crestor up to 20 mg daily  Please change metoprolol tartrate to metoprolol succinate 50 mg once a day  If you need a refill on your cardiac medications before your next appointment, please call your pharmacy.   Lab work: Your physician recommends that you return for a FASTING lipid profile in 3 months   - You will need to be fasting. Please do not have anything to eat or drink after midnight the morning you have the lab work. You may only have water or black coffee with no cream or sugar.   - Please go to the [facility]. You will check in at the front desk to the right as you walk into the atrium. Valet Parking is offered if needed. - No appointment needed. You may go any day between 7 am and 6 pm.    Testing/Procedures: No new testing needed  Follow-Up: At [facility], you and your health needs are our priority.  As part of our continuing mission to provide you with exceptional heart care, we have created designated Provider Care Teams.  These Care Teams include your primary Cardiologist (physician) and Advanced Practice Providers (APPs -  Physician Assistants and Nurse Practitioners) who all work together to provide you with the care you need, when you need it.  You will need a follow up appointment in 12 months  Providers on your designated Care Team:   Nicolasa Ducking, NP Eula Listen, PA-C Cadence Fransico Michael, [place]  COVID-19 Vaccine Information can be found at: [email or web address] For questions related to vaccine distribution or appointments, please email [email or web address] or call [phone].

## 2021-07-26 ENCOUNTER — Other Ambulatory Visit: Payer: Self-pay | Admitting: *Deleted

## 2021-07-26 MED ORDER — ROSUVASTATIN CALCIUM 10 MG PO TABS: 10.0000 mg | ORAL_TABLET | Freq: Every day | ORAL | 3 refills | Status: DC

## 2021-07-27 ENCOUNTER — Other Ambulatory Visit: Payer: Self-pay | Admitting: Cardiovascular Disease

## 2021-07-28 ENCOUNTER — Encounter: Payer: Self-pay | Admitting: Cardiovascular Disease

## 2021-07-28 MED ORDER — ROSUVASTATIN CALCIUM 20 MG PO TABS: 20.0000 mg | ORAL_TABLET | Freq: Every day | ORAL | 3 refills | Status: DC

## 2021-07-28 MED ORDER — METOPROLOL SUCCINATE ER 50 MG PO TB24: 50.0000 mg | ORAL_TABLET | Freq: Every day | ORAL | 3 refills | Status: DC

## 2021-09-20 IMAGING — CT CT HEART MORP W/ CTA COR W/ SCORE W/ CA W/CM &/OR W/O CM
1 series · 6 of 8 positions shown, 8 images · non-contrast
Comparison: None.
COMPARISON: None.

Addendum:
EXAM:
OVER-READ INTERPRETATION  CT CHEST

The following report is an over-read performed by radiologist Dr.
Jenni Ja Eetu Lunabba [REDACTED] on 07/30/2020. This
over-read does not include interpretation of cardiac or coronary
anatomy or pathology. The coronary calcium score/coronary CTA
interpretation by the cardiologist is attached.
CLINICAL DATA: 39 year old male with chest pain
Cardiac/Coronary CTA
TECHNIQUE: The patient was scanned on a Phillips Force scanner.

[Series 330: coronaries · 6 of 8 slices shown, 8 images]
[im 2/8  vessel]
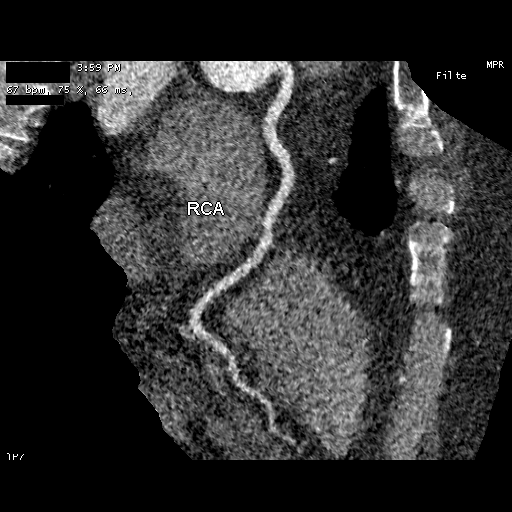
[im 2/8  lung]
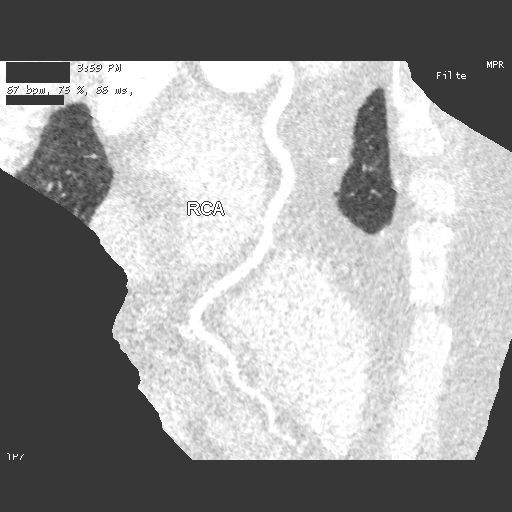
[im 3/8  vessel]
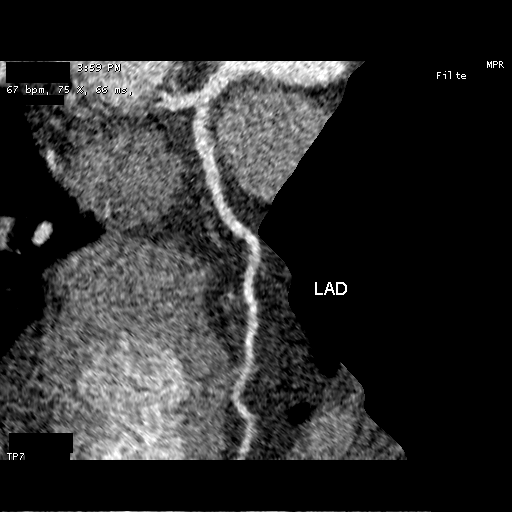
[im 4/8  vessel]
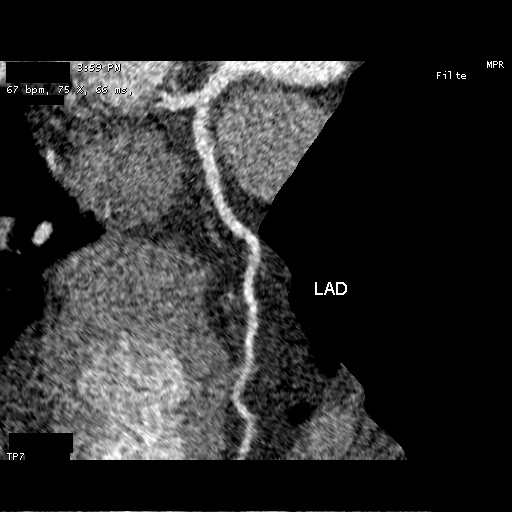
[im 5/8  vessel]
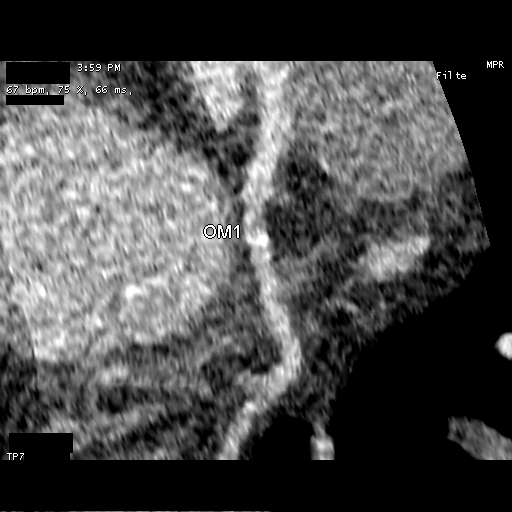
[im 6/8  vessel]
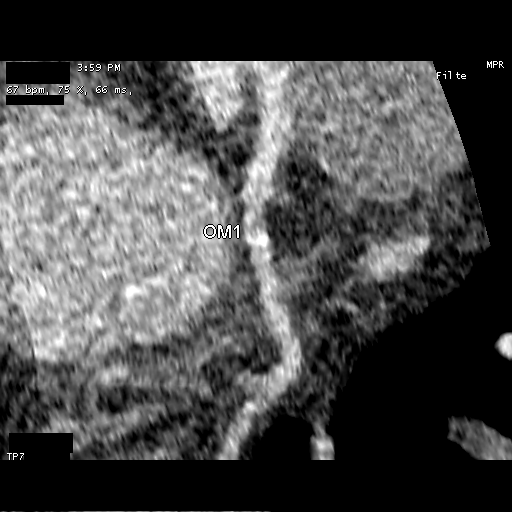
[im 6/8  lung]
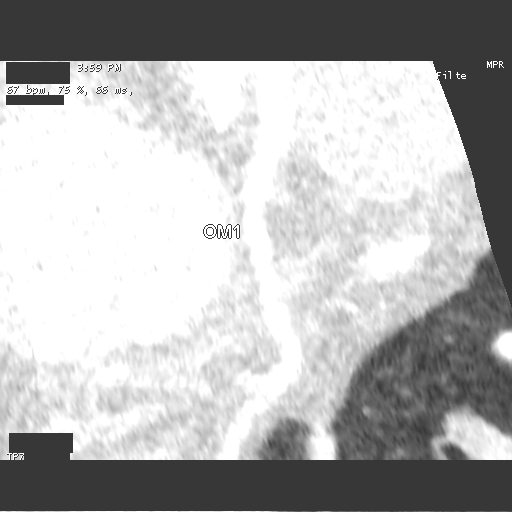
[im 7/8  vessel]
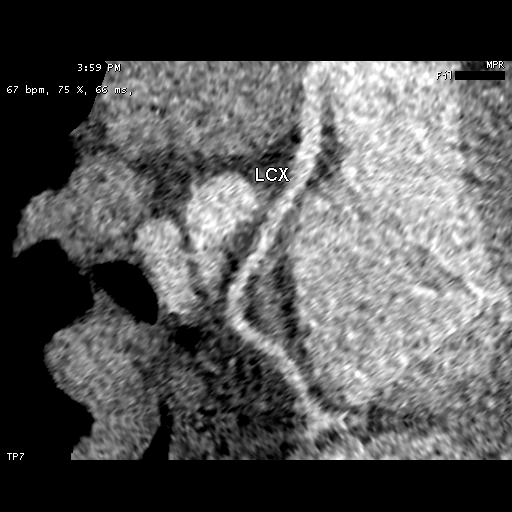

[6 of 8 positions shown; findings below may reference images not displayed]

FINDINGS: Within the visualized portions of the thorax there are no suspicious
appearing pulmonary nodules or masses, there is no acute
consolidative airspace disease, no pleural effusions, no
pneumothorax and no lymphadenopathy. Visualized portions of the
upper abdomen are unremarkable. There are no aggressive appearing
lytic or blastic lesions noted in the visualized portions of the
skeleton.
IMPRESSION: 1. No significant incidental noncardiac findings are noted.
FINDINGS: A 100 kV prospective scan was triggered in the descending thoracic
aorta at 111 HU's. Axial non-contrast 3 mm slices were carried out
through the heart. The data set was analyzed on a dedicated work
station and scored using the Agatson method. Gantry rotation speed
was 250 msecs and collimation was .6 mm. 0.8 mg of sl NTG was given.
The 3D data set was reconstructed in 5% intervals of the 35-75 % of
the R-R cycle. Phases were analyzed on a dedicated work station
using MPR, MIP and VRT modes. The patient received 80 cc of
contrast.

Coronary Arteries:  Normal coronary origin.  Right dominance.

RCA is a large dominant artery that gives rise to PDA and PLA. There
is calcified plaque in the distal RCA causing 0-24% stenosis

Left main is a large artery that gives rise to LAD and LCX arteries.

LAD is a large vessel. There is calcified plaque in the proximal LAD
causing 0-24% stenosis

LCX is a non-dominant artery. There is calcified plaque in OM2
causing 25-49% stenosis

Other findings:

Left Ventricle: Normal size

Left Atrium: Mild enlargement

Pulmonary Veins: Normal configuration

Right Ventricle: Normal size

Right Atrium: Mild enlargement

Cardiac valves: No calcifications

Thoracic aorta: Normal size

Pulmonary Arteries: Dilated main PA measuring 32mm

Systemic Veins: Normal drainage

Pericardium: Normal thickness
IMPRESSION: 1. Poor quality study from significant noise due to obesity, which
limits interpretation

2. Coronary calcium score of 9. There are no age-based percentiles
for less than 45 years old, but this would be 82nd percentile for
age 45.

3. Normal coronary origin with right dominance.

4. Nonobstructive CAD. Most significant lesion is calcified plaque
in OM2 that appears to cause mild stenosis, but difficult to
interpret in setting of significant noise as above

CAD-RADS 2. Mild non-obstructive CAD (25-49%). Consider
non-atherosclerotic causes of chest pain. Consider preventive
therapy and risk factor modification.

*** End of Addendum ***
EXAM:
OVER-READ INTERPRETATION  CT CHEST

The following report is an over-read performed by radiologist Dr.
Jenni Ja Eetu Lunabba [REDACTED] on 07/30/2020. This
over-read does not include interpretation of cardiac or coronary
anatomy or pathology. The coronary calcium score/coronary CTA
interpretation by the cardiologist is attached.
FINDINGS: Within the visualized portions of the thorax there are no suspicious
appearing pulmonary nodules or masses, there is no acute
consolidative airspace disease, no pleural effusions, no
pneumothorax and no lymphadenopathy. Visualized portions of the
upper abdomen are unremarkable. There are no aggressive appearing
lytic or blastic lesions noted in the visualized portions of the
skeleton.
IMPRESSION: 1. No significant incidental noncardiac findings are noted.

## 2022-02-08 ENCOUNTER — Encounter: Payer: Self-pay | Admitting: Cardiovascular Disease

## 2022-02-14 ENCOUNTER — Encounter: Payer: Self-pay | Admitting: *Deleted

## 2022-04-23 ENCOUNTER — Other Ambulatory Visit: Payer: Self-pay | Admitting: Family Medicine

## 2022-04-23 ENCOUNTER — Ambulatory Visit
Admission: RE | Admit: 2022-04-23 | Discharge: 2022-04-23 | Disposition: A | Discharge status: Home / Self Care | Payer: 59 | Source: Ambulatory Visit | Attending: Family Medicine | Admitting: Family Medicine

## 2022-04-23 DIAGNOSIS — J209 Acute bronchitis, unspecified: Secondary | ICD-10-CM | POA: Diagnosis not present

## 2022-07-01 ENCOUNTER — Other Ambulatory Visit: Payer: Self-pay | Admitting: Cardiovascular Disease

## 2022-10-01 ENCOUNTER — Other Ambulatory Visit: Payer: Self-pay | Admitting: Cardiovascular Disease

## 2022-10-03 ENCOUNTER — Telehealth: Payer: Self-pay | Admitting: Cardiovascular Disease

## 2022-10-03 NOTE — Telephone Encounter (Signed)
Left voice mail to schedule appt

## 2022-10-03 NOTE — Telephone Encounter (Signed)
Left voice mail, needs to schedule an appt from recall

## 2022-10-03 NOTE — Telephone Encounter (Signed)
Please contact pt for future appointment. Pt overdue for 12 month f/u. 

## 2022-10-06 NOTE — Telephone Encounter (Signed)
Pt scheduled on 9/6

## 2022-10-13 NOTE — Progress Notes (Signed)
Cardiology Office Note  Date:  10/14/2022   ID:  Danny Jenkins, DOB 1980/08/25, MRN [id number]  PCP:  Galen Manila, NP (Inactive)   Chief Complaint  Patient presents with   12 month follow up     "Doing well." Medications reviewed by the patient verbally.     HPI:  Danny Jenkins is a 42 year old gentleman with no prior cardiac history History of morbid obesity weight >400 pounds Hypertension Hyperlipidemia Cardiac CTA 6/23: nonobstructive, calcium score 9 Who presents for f/u of his  irregular heartbeat  Last seen by myself in clinic June 2023 Covid 3/24, PNA/bronchitis Required long course of ABX Recovery extended into 5/24  Reports feeling well currently, Works at home, IT/cyber security Periodic muscle pain in shoulder, chest Reports he has started to walk his dog in the mornings  Tolerating Crestor 20 mg daily Palpitations suppressed with metoprolol  EKG personally reviewed by myself on todays visit EKG Interpretation Date/Time:  Friday October 14 2022 11:39:08 EDT Ventricular Rate:  66 PR Interval:  162 QRS Duration:  84 QT Interval:  402 QTC Calculation: 421 R Axis:   -3  Text Interpretation: Normal sinus rhythm Normal ECG When compared with ECG of 14-Dec-2018 21:56, No significant change was found Confirmed by Julien Nordmann [phone]) on 10/14/2022 11:55:35 AM   Other past medical history reviewed Previously reported noting PVCs on his Apple Watch Reports that he has snoring, never been diagnosed with sleep apnea  Previously seen in the emergency room November 2020 for chest pain Chest pain felt to be atypical in nature  Mother with palpitations, Father with premature coronary disease, with stent x 2  PMH:   has a past medical history of Allergy, Anxiety, Hyperlipidemia, and Hypertension.  PSH:    Past Surgical History:  Procedure Laterality Date   APPENDECTOMY     EYE SURGERY  1987    Current Outpatient Medications  Medication  Sig Dispense Refill   metoprolol succinate (TOPROL-XL) 50 MG 24 hr tablet Take 1 tablet (50 mg total) by mouth daily. PLEASE CALL OFFICE TO SCHEDULE APPOINTMENT PRIOR TO NEXT REFILL (first attempt) 30 tablet 0   rosuvastatin (CRESTOR) 20 MG tablet Take 1 tablet (20 mg total) by mouth daily. PLEASE CALL OFFICE TO SCHEDULE APPOINTMENT PRIOR TO NEXT REFILL (first attempt) 30 tablet 0   No current facility-administered medications for this visit.    Allergies:   Sulfa antibiotics   Social History:  The patient  reports that he has quit smoking. He has never used smokeless tobacco. He reports current alcohol use. He reports that he does not use drugs.   Family History:   family history includes CAD in his father; Heart attack in his father; Hyperlipidemia in his father and mother.    Review of Systems: Review of Systems  Constitutional: Negative.   HENT: Negative.    Respiratory: Negative.    Cardiovascular: Negative.   Gastrointestinal: Negative.   Musculoskeletal: Negative.   Neurological: Negative.   Psychiatric/Behavioral: Negative.    All other systems reviewed and are negative.  PHYSICAL EXAM: VS:  BP 120/80 (BP Location: Left Arm, Patient Position: Sitting, Cuff Size: Large)   Pulse 77   Ht 6' (1.829 m)   Wt (!) 486 lb 4 oz (220.6 kg)   SpO2 97%   BMI 65.95 kg/m  , BMI Body mass index is 65.95 kg/m. Constitutional:  oriented to person, place, and time. No distress.  HENT:  Head: Grossly normal Eyes:  no discharge. No scleral  icterus.  Neck: No JVD, no carotid bruits  Cardiovascular: Regular rate and rhythm, no murmurs appreciated Pulmonary/Chest: Clear to auscultation bilaterally, no wheezes or rails Abdominal: Soft.  no distension.  no tenderness.  Musculoskeletal: Normal range of motion Neurological:  normal muscle tone. Coordination normal. No atrophy Skin: Skin warm and dry Psychiatric: normal affect, pleasant  Recent Labs: No results found for requested labs  within last 365 days.    Lipid Panel Lab Results  Component Value Date   CHOL 178 07/01/2020   HDL 50 07/01/2020   LDLCALC 87 07/01/2020   TRIG 206 (H) 07/01/2020    Wt Readings from Last 3 Encounters:  10/14/22 (!) 486 lb 4 oz (220.6 kg)  07/23/21 (!) 490 lb (222.3 kg)  07/03/20 (!) 482 lb (218.6 kg)     ASSESSMENT AND PLAN:  Problem List Items Addressed This Visit       Cardiology Problems   Hyperlipidemia   Other Visit Diagnoses     Shortness of breath    -  Primary   Relevant Orders   EKG 12-Lead (Completed)   Racing heart beat       Relevant Orders   EKG 12-Lead (Completed)   Precordial pain       Relevant Orders   EKG 12-Lead (Completed)   BMI 60.0-69.9, adult (HCC)       History of tobacco use           PVC/irregular rhythm Symptoms well-controlled on metoprolol succinate 50 mg daily  Hyperlipidemia Cardiac CTA with nonobstructive coronary disease  Continue Crestor 20 mg daily Lipid panel ordered today  Morbid obesity We have encouraged continued exercise, careful diet management in an effort to lose weight.   Total encounter time more than 30 minutes  Greater than 50% was spent in counseling and coordination of care with the patient    Signed, Dossie Arbour, M.D., Ph.D. [facility] [place], [place] [phone]

## 2022-10-14 ENCOUNTER — Ambulatory Visit: Payer: 59 | Attending: Cardiovascular Disease | Admitting: Cardiovascular Disease

## 2022-10-14 ENCOUNTER — Encounter: Payer: Self-pay | Admitting: Cardiovascular Disease

## 2022-10-14 VITALS — BP 120/80 | HR 77 | Ht 72.0 in | Wt >= 6400 oz

## 2022-10-14 DIAGNOSIS — R072 Precordial pain: Secondary | ICD-10-CM

## 2022-10-14 DIAGNOSIS — Z87891 Personal history of nicotine dependence: Secondary | ICD-10-CM

## 2022-10-14 DIAGNOSIS — R0602 Shortness of breath: Secondary | ICD-10-CM | POA: Diagnosis not present

## 2022-10-14 DIAGNOSIS — E7800 Pure hypercholesterolemia, unspecified: Secondary | ICD-10-CM

## 2022-10-14 DIAGNOSIS — Z6841 Body Mass Index (BMI) 40.0 and over, adult: Secondary | ICD-10-CM

## 2022-10-14 DIAGNOSIS — R000 Tachycardia, unspecified: Secondary | ICD-10-CM

## 2022-10-14 DIAGNOSIS — Z79899 Other long term (current) drug therapy: Secondary | ICD-10-CM

## 2022-10-14 MED ORDER — METOPROLOL SUCCINATE ER 50 MG PO TB24: 50.0000 mg | ORAL_TABLET | Freq: Every day | ORAL | 4 refills | Status: DC

## 2022-10-14 MED ORDER — ROSUVASTATIN CALCIUM 20 MG PO TABS: 20.0000 mg | ORAL_TABLET | Freq: Every day | ORAL | 4 refills | Status: DC

## 2022-10-14 NOTE — Patient Instructions (Addendum)
Medication Instructions:  No changes  If you need a refill on your cardiac medications before your next appointment, please call your pharmacy.   Lab work: Labs: CMP, lipids, A1C, CBC  Testing/Procedures: No new testing needed  Follow-Up: At [facility], you and your health needs are our priority.  As part of our continuing mission to provide you with exceptional heart care, we have created designated Provider Care Teams.  These Care Teams include your primary Cardiologist (physician) and Advanced Practice Providers (APPs -  Physician Assistants and Nurse Practitioners) who all work together to provide you with the care you need, when you need it.  You will need a follow up appointment in 12 months  Providers on your designated Care Team:   Nicolasa Ducking, NP Eula Listen, PA-C Cadence Fransico Michael, [place]  COVID-19 Vaccine Information can be found at: [email or web address] For questions related to vaccine distribution or appointments, please email [email or web address] or call [phone].

## 2022-10-15 LAB — COMPREHENSIVE METABOLIC PANEL
ALT: 30 IU/L (ref 0–44)
AST: 18 IU/L (ref 0–40)
Albumin: 4.5 g/dL (ref 4.1–5.1)
Alkaline Phosphatase: 82 IU/L (ref 44–121)
BUN/Creatinine Ratio: 15 (ref 9–20)
BUN: 13 mg/dL (ref 6–24)
Bilirubin Total: 0.4 mg/dL (ref 0.0–1.2)
CO2: 25 mmol/L (ref 20–29)
Calcium: 9.5 mg/dL (ref 8.7–10.2)
Chloride: 101 mmol/L (ref 96–106)
Creatinine, Ser: 0.84 mg/dL (ref 0.76–1.27)
Globulin, Total: 2.1 g/dL (ref 1.5–4.5)
Glucose: 90 mg/dL (ref 70–99)
Potassium: 4.3 mmol/L (ref 3.5–5.2)
Sodium: 138 mmol/L (ref 134–144)
Total Protein: 6.6 g/dL (ref 6.0–8.5)
eGFR: 111 mL/min/{1.73_m2} (ref >59)

## 2022-10-15 LAB — CBC
Hematocrit: 41.3 % (ref 37.5–51.0)
Hemoglobin: 13.8 g/dL (ref 13.0–17.7)
MCH: 26.7 pg (ref 26.6–33.0)
MCHC: 33.4 g/dL (ref 31.5–35.7)
MCV: 80 fL (ref 79–97)
Platelets: 255 10*3/uL (ref 150–450)
RBC: 5.16 x10E6/uL (ref 4.14–5.80)
RDW: 13.8 % (ref 11.6–15.4)
WBC: 6.4 10*3/uL (ref 3.4–10.8)

## 2022-10-15 LAB — LIPID PANEL
Chol/HDL Ratio: 3.2 ratio (ref 0.0–5.0)
Cholesterol, Total: 141 mg/dL (ref 100–199)
HDL: 44 mg/dL (ref >39)
LDL Chol Calc (NIH): 76 mg/dL (ref 0–99)
Triglycerides: 113 mg/dL (ref 0–149)
VLDL Cholesterol Cal: 21 mg/dL (ref 5–40)

## 2022-10-15 LAB — HEMOGLOBIN A1C
Est. average glucose Bld gHb Est-mCnc: 117 mg/dL
Hgb A1c MFr Bld: 5.7 % — ABNORMAL HIGH (ref 4.8–5.6)

## 2023-04-27 ENCOUNTER — Encounter: Admitting: Urology

## 2023-05-10 ENCOUNTER — Ambulatory Visit: Admitting: Student

## 2023-05-16 ENCOUNTER — Ambulatory Visit: Admitting: Urology

## 2023-05-16 ENCOUNTER — Other Ambulatory Visit: Payer: Self-pay | Admitting: Urology

## 2023-05-16 ENCOUNTER — Telehealth: Payer: Self-pay

## 2023-05-16 ENCOUNTER — Other Ambulatory Visit: Payer: Self-pay

## 2023-05-16 ENCOUNTER — Encounter: Payer: Self-pay | Admitting: Urology

## 2023-05-16 VITALS — BP 139/82 | Ht 72.0 in | Wt >= 6400 oz

## 2023-05-16 DIAGNOSIS — Z3009 Encounter for other general counseling and advice on contraception: Secondary | ICD-10-CM

## 2023-05-16 DIAGNOSIS — Z302 Encounter for sterilization: Secondary | ICD-10-CM

## 2023-05-16 NOTE — Progress Notes (Signed)
 05/16/23 10:44 AM   Danny Jenkins April 17, 1980 [id number]  CC: Discuss vasectomy  HPI: 43 year old male with morbid obesity and BMI of 66 interested in vasectomy for permanent sterilization.  He and his wife have 2 children and do not desire further biologic pregnancies.   PMH: Past Medical History:  Diagnosis Date   Allergy 2012   Seasonal allergies   Anxiety    Hyperlipidemia    Hypertension     Surgical History: Past Surgical History:  Procedure Laterality Date   APPENDECTOMY  2007   EYE SURGERY  1987     Family History: Family History  Problem Relation Age of Onset   Hyperlipidemia Mother    Cancer Mother    Heart attack Father    CAD Father        2 stents   Hyperlipidemia Father    Hypertension Father    Obesity Father    Cancer Maternal Grandmother    Diabetes Paternal Grandfather    Obesity Paternal Grandmother    ADD / ADHD Son    Obesity Paternal Aunt    Prostate cancer Neg Hx    Bladder Cancer Neg Hx    Kidney cancer Neg Hx     Social History:  reports that he has quit smoking. He has never used smokeless tobacco. He reports current alcohol use. He reports that he does not use drugs.  Physical Exam: BP 139/82   Ht 6' (1.829 m)   Wt (!) 486 lb (220.4 kg)   BMI 65.91 kg/m    Constitutional:  Alert and oriented, No acute distress. Cardiovascular: No clubbing, cyanosis, or edema. Respiratory: Normal respiratory effort, no increased work of breathing. 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  We discussed the risks and benefits of vasectomy at length.  Vasectomy is intended to be a permanent form of  contraception, and does not produce immediate sterility.  Following vasectomy another form of contraception is required until vas occlusion is confirmed by a post-vasectomy semen analysis obtained 2-3 months after the procedure.  Even after vas occlusion is confirmed, vasectomy is not 100% reliable in preventing pregnancy, and the failure rate is approximately 02/1998.  Repeat vasectomy is required in less than 1% of patients.  He should refrain from ejaculation for 1 week after vasectomy.  Options for fertility after vasectomy include vasectomy reversal, and sperm retrieval with in vitro fertilization or ICSI.  These options are not always successful and may be expensive.  Finally, there are other permanent and non-permanent alternatives to vasectomy available. There is no risk of erectile dysfunction, and the volume of semen will be similar to prior, as the majority of the ejaculate is from the prostate and seminal vesicles.   The procedure takes ~20 minutes.   Local anesthetic is injected into the scrotal skin and a small segment of the vas deferens is removed, and the ends occluded. The complication rate is approximately 1-2%, and includes bleeding, infection, and development of chronic scrotal pain.  With his morbid obesity and difficult exam, will need LMA in OR and sedation.  PLAN: Schedule vasectomy in OR(BMI 51)   Legrand Rams, MD 05/16/2023  [facility] [address]  [place], [place] [address] [phone]

## 2023-05-16 NOTE — Telephone Encounter (Signed)
 Per Dr. Richardo Hanks, Patient is to be scheduled for Vasectomy   Danny Jenkins was contacted and possible surgical dates were discussed, Friday May 9th, 2025 was agreed upon for surgery.   Patient was directed to call [phone] between 1-3pm the day before surgery to find out surgical arrival time.  Instructions were given not to eat or drink from midnight on the night before surgery and have a driver for the day of surgery. On the surgery day patient was instructed to enter through the [facility] entrance of [facility] report the Same Day Surgery desk.   Pre-Admit Testing will be in contact via phone to set up an interview with the anesthesia team to review your history and medications prior to surgery.   Reminder of this information was sent via MyChart to the patient.

## 2023-05-16 NOTE — Progress Notes (Signed)
   [facility] Surgical Posting Form  Surgery Date: Date: 06/16/2023  Surgeon: Dr. Legrand Rams, MD  Inpt ( No  )   Outpt (Yes)   Obs ( No  )   Diagnosis: Z30.2 Encounter for Sterilization  -CPT: [id number]  Surgery: Vasectomy  Stop Anticoagulations: Yes  Cardiac/Medical/Pulmonary Clearance needed: no  *Orders entered into EPIC  Date: 05/16/23   *Case booked in [place]  Date: 05/16/23  *Notified pt of Surgery: Date: 05/16/23  PRE-OP UA & CX: no  *Placed into Prior Authorization Work [place] Date: 05/16/23  Assistant/laser/rep:No

## 2023-05-16 NOTE — Progress Notes (Signed)
 Surgical Physician Order Form [facility]  Dr. Legrand Rams, MD  * Scheduling expectation : Next Available  *Length of Case: 30 minutes  *Clearance needed: no  *Anticoagulation Instructions: Hold all anticoagulants  *Aspirin Instructions: Hold Aspirin  *Post-op visit Date/Instructions: 45-month semen analysis lab  *Diagnosis: Desire for sterilization  *Procedure:  Vasectomy (16109)   Additional orders: N/A  -Admit type: OUTpatient  -Anesthesia: General  -VTE Prophylaxis Standing Order SCD's       Other:   -Standing Lab Orders Per Anesthesia    Lab other: None  -Standing Test orders EKG/Chest x-ray per Anesthesia       Test other:   - Medications:  Ancef 2gm IV  -Other orders:  N/A

## 2023-05-16 NOTE — Patient Instructions (Signed)

## 2023-06-07 ENCOUNTER — Telehealth: Payer: Self-pay

## 2023-06-07 NOTE — Telephone Encounter (Signed)
 Patient called in today and states that he is having a lot issues with his family and needs to cancel his VAS in the OR. He will call back to schedule. Patient cancelled with OR and follow up appts.

## 2023-06-08 ENCOUNTER — Inpatient Hospital Stay: Admission: RE | Admit: 2023-06-08 | Source: Ambulatory Visit

## 2023-06-16 ENCOUNTER — Ambulatory Visit: Admit: 2023-06-16 | Admitting: Urology

## 2023-06-16 SURGERY — VASECTOMY
Anesthesia: General | Laterality: Bilateral

## 2023-06-30 ENCOUNTER — Other Ambulatory Visit: Payer: Self-pay | Admitting: Family Medicine

## 2023-06-30 ENCOUNTER — Encounter: Payer: Self-pay | Admitting: Family Medicine

## 2023-06-30 ENCOUNTER — Ambulatory Visit: Admitting: Family Medicine

## 2023-06-30 ENCOUNTER — Telehealth: Payer: Self-pay

## 2023-06-30 DIAGNOSIS — I10 Essential (primary) hypertension: Secondary | ICD-10-CM | POA: Insufficient documentation

## 2023-06-30 DIAGNOSIS — F32A Depression, unspecified: Secondary | ICD-10-CM | POA: Insufficient documentation

## 2023-06-30 DIAGNOSIS — I493 Ventricular premature depolarization: Secondary | ICD-10-CM | POA: Diagnosis not present

## 2023-06-30 DIAGNOSIS — R7303 Prediabetes: Secondary | ICD-10-CM | POA: Insufficient documentation

## 2023-06-30 DIAGNOSIS — E782 Mixed hyperlipidemia: Secondary | ICD-10-CM

## 2023-06-30 DIAGNOSIS — F419 Anxiety disorder, unspecified: Secondary | ICD-10-CM | POA: Insufficient documentation

## 2023-06-30 MED ORDER — TIRZEPATIDE-WEIGHT MANAGEMENT 2.5 MG/0.5ML ~~LOC~~ SOLN
2.5000 mg | SUBCUTANEOUS | 0 refills | Status: DC
Start: 2023-06-30 — End: 2023-07-30

## 2023-06-30 MED ORDER — OLMESARTAN MEDOXOMIL 5 MG PO TABS: 5.0000 mg | ORAL_TABLET | Freq: Every day | ORAL | 0 refills | Status: DC

## 2023-06-30 NOTE — Assessment & Plan Note (Signed)
  Discussed in detail about wieght loss strategies. Using apps Like  NOOM, Diabetes.org, meal planning, continuing Gym. Given his Morbid obesity and comorbid conditions, I feel pt will benefit by starting GLPs: zepbound, which help me with weight loss.

## 2023-06-30 NOTE — Telephone Encounter (Signed)
 Please complete PA for Zepbound. No key created.  KP

## 2023-06-30 NOTE — Progress Notes (Signed)
 New Patient Office Visit  Subjective    Patient ID: Danny Jenkins, male    DOB: 01-15-1981  Age: 43 y.o. MRN: [id number]  CC:  Chief Complaint  Patient presents with   Establish Care   Weight Loss    Never been on medication for weight loss     HPI Danny Jenkins with PMH of severe morbid obesity, hypertension, PVCs, HLD, depression, Anxiety presents to establish care. Did not see a provider for a long time.   Concerns:   Weight loss.  States tried everything for a long time, but still has hard time losing weight.  Diet :  weight watcher, keto, atkins, paleo. Exercise: walking and joined [employer]. He is not interested in surgery.   Discussed in detail about weight loss strategies. Using apps Like  NOOM, Diabetes.org, meal planning, continuing Gym. Given his Morbid obesity and comorbid conditions, I feel pt will benefit by starting GLPs: zepbound, which help me with weight loss.  History of morbid obesity weight >400 pounds Hypertension Hyperlipidemia Cardiac CTA 6/23: nonobstructive, calcium  score 9 Follows cardiology.  history of Allergy, Anxiety. [facility]. Father had MI at AGE 4.    Outpatient Encounter Medications as of 06/30/2023  Medication Sig   buPROPion (WELLBUTRIN XL) 300 MG 24 hr tablet Take 300 mg by mouth every morning.   guanFACINE (INTUNIV) 2 MG TB24 ER tablet Take 2 mg by mouth daily.   metoprolol  succinate (TOPROL -XL) 50 MG 24 hr tablet Take 1 tablet (50 mg total) by mouth daily.   olmesartan (BENICAR) 5 MG tablet Take 1 tablet (5 mg total) by mouth daily.   ondansetron (ZOFRAN-ODT) 4 MG disintegrating tablet Take by mouth.   rosuvastatin  (CRESTOR ) 20 MG tablet Take 1 tablet (20 mg total) by mouth daily.   tirzepatide (ZEPBOUND) 2.5 MG/0.5ML injection vial Inject 2.5 mg into the skin once a week.   [DISCONTINUED] guanFACINE (INTUNIV) 1 MG TB24 ER tablet Take 1 mg by mouth at bedtime.   No  facility-administered encounter medications on file as of 06/30/2023.    Past Medical History:  Diagnosis Date   Allergy 2012   Seasonal allergies   Anxiety 2013   Hyperlipidemia 2020   On a statin to control   Hypertension 1998   Lower due to beta blocker    Past Surgical History:  Procedure Laterality Date   APPENDECTOMY  2007   EYE SURGERY  1987   WISDOM TOOTH EXTRACTION  2013    Family History  Problem Relation Age of Onset   Hyperlipidemia Mother    Cancer Mother    Heart disease Father    Heart attack Father    CAD Father        2 stents   Hyperlipidemia Father    Hypertension Father    Obesity Father    ADD / ADHD Daughter    ADD / ADHD Son    Obesity Paternal Aunt    Cancer Maternal Grandmother    Obesity Paternal Grandmother    Diabetes Paternal Grandfather    Prostate cancer Neg Hx    Bladder Cancer Neg Hx    Kidney cancer Neg Hx     Social History   Socioeconomic History   Marital status: Married    Spouse name: Not on file   Number of children: 2   Years of education: Not on file   Highest education level: Doctorate  Occupational History   Not on file  Tobacco Use  Smoking status: Former    Current packs/day: 0.00    Average packs/day: 0.5 packs/day for 4.0 years (2.0 ttl pk-yrs)    Types: Cigarettes    Start date: 2000    Quit date: 02/23/2002    Years since quitting: 21.3   Smokeless tobacco: Never  Vaping Use   Vaping status: Never Used  Substance and Sexual Activity   Alcohol use: Not Currently   Drug use: No   Sexual activity: Yes    Birth control/protection: Pill    Comment: Scheduled to get vasectomy  Other Topics Concern   Not on file  Social History Narrative   Not on file   Social Drivers of Health   Financial Resource Strain: Low Risk  (05/09/2023)   Overall Financial Resource Strain (CARDIA)    Difficulty of Paying Living Expenses: Not hard at all  Food Insecurity: No Food Insecurity (05/09/2023)   Hunger Vital Sign     Worried About Running Out of Food in the Last Year: Never true    Ran Out of Food in the Last Year: Never true  Transportation Needs: No Transportation Needs (05/09/2023)   PRAPARE - Administrator, Civil Service (Medical): No    Lack of Transportation (Non-Medical): No  Physical Activity: Insufficiently Active (05/09/2023)   Exercise Vital Sign    Days of Exercise per Week: 3 days    Minutes of Exercise per Session: 20 min  Stress: Stress Concern Present (05/09/2023)   [employer] of Occupational Health - Occupational Stress Questionnaire    Feeling of Stress : To some extent  Social Connections: Moderately Integrated (05/09/2023)   Social Connection and Isolation Panel [NHANES]    Frequency of Communication with Friends and Family: Once a week    Frequency of Social Gatherings with Friends and Family: Once a week    Attends Religious Services: More than 4 times per year    Active Member of [employer] or Organizations: Yes    Attends Banker Meetings: More than 4 times per year    Marital Status: Married  Catering manager Violence: Not At Risk (06/30/2023)   Humiliation, Afraid, Rape, and Kick questionnaire    Fear of Current or Ex-Partner: No    Emotionally Abused: No    Physically Abused: No    Sexually Abused: No    Review of Systems  All other systems reviewed and are negative.       Objective    BP (!) 120/90   Pulse 64   Wt (!) 465 lb (210.9 kg)   SpO2 97%   BMI 63.07 kg/m   Physical Exam Vitals and nursing note reviewed.  Constitutional:      Appearance: Normal appearance.  HENT:     Head: Normocephalic.     Right Ear: External ear normal.     Left Ear: External ear normal.  Eyes:     Conjunctiva/sclera: Conjunctivae normal.  Cardiovascular:     Rate and Rhythm: Normal rate.  Pulmonary:     Effort: Pulmonary effort is normal.  Abdominal:     Palpations: Abdomen is soft.  Musculoskeletal:        General: Normal range of motion.   Skin:    General: Skin is warm.  Neurological:     Mental Status: He is alert and oriented to person, place, and time.  Psychiatric:        Mood and Affect: Mood normal.        Assessment & Plan:  Problem List Items Addressed This Visit       Cardiovascular and Mediastinum   Essential hypertension   Elevated diastolic pressure : Will start Benicar and reassess in a month.      Relevant Medications   tirzepatide (ZEPBOUND) 2.5 MG/0.5ML injection vial   olmesartan (BENICAR) 5 MG tablet     Other   Hyperlipidemia   Relevant Medications   tirzepatide (ZEPBOUND) 2.5 MG/0.5ML injection vial   olmesartan (BENICAR) 5 MG tablet   Morbid (severe) obesity due to excess calories (HCC) - Primary    Discussed in detail about wieght loss strategies. Using apps Like  NOOM, Diabetes.org, meal planning, continuing Gym. Given his Morbid obesity and comorbid conditions, I feel pt will benefit by starting GLPs: zepbound, which help me with weight loss.      Relevant Medications   guanFACINE (INTUNIV) 2 MG TB24 ER tablet   tirzepatide (ZEPBOUND) 2.5 MG/0.5ML injection vial   Anxiety and depression   Prediabetes   Relevant Medications   tirzepatide (ZEPBOUND) 2.5 MG/0.5ML injection vial   Other Visit Diagnoses       PVC (premature ventricular contraction)       Relevant Medications   olmesartan (BENICAR) 5 MG tablet       Return in about 4 weeks (around 07/28/2023), or if symptoms worsen or fail to improve, for chronic follow up with PCP.   Vinary K Ellison Leisure, MD

## 2023-06-30 NOTE — Assessment & Plan Note (Signed)
 Elevated diastolic pressure : Will start Benicar and reassess in a month.

## 2023-07-04 ENCOUNTER — Telehealth: Payer: Self-pay | Admitting: Pharmacy Technician

## 2023-07-04 ENCOUNTER — Other Ambulatory Visit: Payer: Self-pay | Admitting: Family Medicine

## 2023-07-04 ENCOUNTER — Other Ambulatory Visit (HOSPITAL_COMMUNITY): Payer: Self-pay

## 2023-07-04 MED ORDER — OZEMPIC (0.25 OR 0.5 MG/DOSE) 2 MG/3ML ~~LOC~~ SOPN
0.2500 mg | PEN_INJECTOR | SUBCUTANEOUS | 0 refills | Status: AC
Start: 2023-07-04 — End: 2023-08-03

## 2023-07-04 NOTE — Telephone Encounter (Signed)
Denied by insurance.  KP 

## 2023-07-04 NOTE — Telephone Encounter (Signed)
 Zepbound is PLAN EXCLUSION, his plan will not cover

## 2023-07-04 NOTE — Telephone Encounter (Signed)
 Noted. I called in Ozempic.

## 2023-07-04 NOTE — Telephone Encounter (Signed)
 Noted  Pt is aware thru Mychart.  KP

## 2023-07-04 NOTE — Telephone Encounter (Signed)
 Pharmacy Patient Advocate Encounter   Received notification from Pt Calls Messages that prior authorization for Zepbound 2.5 mg is required/requested.   Insurance verification completed.   The patient is insured through [employer] .   Per test claim: Zepbound is PLAN EXCLUSION, Not covered by the plan

## 2023-07-04 NOTE — Telephone Encounter (Signed)
 Requested medication (s) are due for refill today - no  Requested medication (s) are on the active medication list -no  Future visit scheduled - yes  Last refill: medication no longer listed on current medication list  Notes to clinic: off protocol- provider review   Requested Prescriptions  Pending Prescriptions Disp Refills   ZEPBOUND 2.5 MG/0.5ML Pen [Pharmacy Med Name: ZEPBOUND 2.5 MG/0.5 ML PEN]  0    Sig: INJECT 2.5 MG SUBCUTANEOUSLY WEEKLY     Off-Protocol Failed - 07/04/2023  2:13 PM      Failed - Medication not assigned to a protocol, review manually.      Passed - Valid encounter within last 12 months    Recent Outpatient Visits           4 days ago Morbid (severe) obesity due to excess calories [facility])   [place] [facility] at [facility], Vinay K, MD                 Requested Prescriptions  Pending Prescriptions Disp Refills   ZEPBOUND 2.5 MG/0.5ML Pen [Pharmacy Med Name: ZEPBOUND 2.5 MG/0.5 ML PEN]  0    Sig: INJECT 2.5 MG SUBCUTANEOUSLY WEEKLY     Off-Protocol Failed - 07/04/2023  2:13 PM      Failed - Medication not assigned to a protocol, review manually.      Passed - Valid encounter within last 12 months    Recent Outpatient Visits           4 days ago Morbid (severe) obesity due to excess calories [facility])   [facility] at [facility], Vinay K, MD

## 2023-07-11 ENCOUNTER — Telehealth: Payer: Self-pay

## 2023-07-11 NOTE — Telephone Encounter (Signed)
 Please complete PA for Ozempic .  KP

## 2023-07-12 ENCOUNTER — Other Ambulatory Visit: Payer: Self-pay | Admitting: Family Medicine

## 2023-07-12 ENCOUNTER — Telehealth: Payer: Self-pay | Admitting: Pharmacy Technician

## 2023-07-12 ENCOUNTER — Other Ambulatory Visit (HOSPITAL_COMMUNITY): Payer: Self-pay

## 2023-07-12 DIAGNOSIS — R7303 Prediabetes: Secondary | ICD-10-CM

## 2023-07-12 MED ORDER — SEMAGLUTIDE-WEIGHT MANAGEMENT 0.25 MG/0.5ML ~~LOC~~ SOAJ
0.2500 mg | SUBCUTANEOUS | Status: AC
Start: 2023-07-12 — End: 2023-08-11

## 2023-07-12 NOTE — Telephone Encounter (Signed)
 Pharmacy Patient Advocate Encounter   Received notification from Pt Calls Messages that prior authorization for Wegovy 0.25MG /0.5ML auto-injectors is required/requested.   Insurance verification completed.   The patient is insured through [employer] .   Per test claim: PA required; PA started via CoverMyMeds. KEY [id number] . Waiting for clinical questions to populate.

## 2023-07-12 NOTE — Telephone Encounter (Signed)
 He has prediabetes can we use it and try PA again for ozempic .Ty

## 2023-07-12 NOTE — Telephone Encounter (Signed)
Ty!

## 2023-07-12 NOTE — Telephone Encounter (Signed)
 Discussed with the patient about denial. He is willing to try [facility] sample. He will stop by to pick it up.

## 2023-07-12 NOTE — Telephone Encounter (Signed)
 Noted  KP

## 2023-07-12 NOTE — Telephone Encounter (Signed)
YW! °

## 2023-07-12 NOTE — Telephone Encounter (Signed)
 Ozempic /Mounjaro  is approved exclusively as an adjunct to diet and exercise to improve glycemic control in adults with type 2 diabetes mellitus. A review of patient's medical chart reveals no documented diagnosis of type 2 diabetes or an A1C indicative of diabetes. Therefore, they do not currently meet the criteria for prior authorization of this medication. If clinically appropriate, alternative options such as Saxenda , Zepbound , or Wegovy  may be considered for this patient.  *****If the A1C from today comes back over 6.5 and there is a new Type 2 diabetes diagnosis, we can run the prior authorization then.  A user error has taken place: encounter opened in error, closed for administrative reasons.

## 2023-07-12 NOTE — Telephone Encounter (Signed)
Denied by insurance.  KP 

## 2023-07-12 NOTE — Telephone Encounter (Signed)
 Can we try PA on Wegovy?

## 2023-07-12 NOTE — Telephone Encounter (Signed)
 Yes, I will submit a PA for wegovy

## 2023-07-12 NOTE — Telephone Encounter (Signed)
 Unfortunately, insurance will not even consider covering Ozempic  unless the pt has Type 2 diabetes diagnosis with supporting A1C labs

## 2023-07-13 ENCOUNTER — Other Ambulatory Visit (HOSPITAL_COMMUNITY): Payer: Self-pay

## 2023-07-13 NOTE — Telephone Encounter (Signed)
 Pharmacy Patient Advocate Encounter  Received notification from [employer] that Prior Authorization for Wegovy 0.25MG /0.5ML auto-injectors has been DENIED.  No reason given; No denial letter received via Fax or CMM. It has been requested and will be uploaded to the media tab once received.   PA #/Case ID/Reference #: [id number]

## 2023-07-14 ENCOUNTER — Other Ambulatory Visit (HOSPITAL_COMMUNITY): Payer: Self-pay

## 2023-07-22 ENCOUNTER — Other Ambulatory Visit: Payer: Self-pay | Admitting: Family Medicine

## 2023-07-22 DIAGNOSIS — I10 Essential (primary) hypertension: Secondary | ICD-10-CM

## 2023-07-25 NOTE — Telephone Encounter (Signed)
 Requested medication (s) are due for refill today: na   Requested medication (s) are on the active medication list: yes   Last refill:  06/30/23- 07/30/23 #30 0 refills  Future visit scheduled: yes 07/28/23  Notes to clinic:  protocol failed last labs 10/14/22. Pharmacy comment: REQUEST FOR 90 DAYS PRESCRIPTION. DX Code Needed.       Requested Prescriptions  Pending Prescriptions Disp Refills   olmesartan  (BENICAR ) 5 MG tablet [Pharmacy Med Name: OLMESARTAN  MEDOXOMIL 5 MG TAB] 90 tablet 1    Sig: Take 1 tablet (5 mg total) by mouth daily.     Cardiovascular:  Angiotensin Receptor Blockers Failed - 07/25/2023 12:28 PM      Failed - Cr in normal range and within 180 days    Creat  Date Value Ref Range Status  12/15/2016 0.81 0.60 - 1.35 mg/dL Final   Creatinine, Ser  Date Value Ref Range Status  10/14/2022 0.84 0.76 - 1.27 mg/dL Final         Failed - K in normal range and within 180 days    Potassium  Date Value Ref Range Status  10/14/2022 4.3 3.5 - 5.2 mmol/L Final         Failed - Last BP in normal range    BP Readings from Last 1 Encounters:  06/30/23 (!) 120/90         Passed - Patient is not pregnant      Passed - Valid encounter within last 6 months    Recent Outpatient Visits           3 weeks ago Morbid (severe) obesity due to excess calories [facility])   [facility] at [facility], Vinay K, MD

## 2023-07-25 NOTE — Telephone Encounter (Signed)
Please review medication refill  request

## 2023-07-28 ENCOUNTER — Ambulatory Visit: Admitting: Family Medicine

## 2023-08-04 ENCOUNTER — Ambulatory Visit: Admitting: Family Medicine

## 2023-08-04 ENCOUNTER — Encounter: Payer: Self-pay | Admitting: Family Medicine

## 2023-09-22 ENCOUNTER — Other Ambulatory Visit

## 2023-10-31 ENCOUNTER — Other Ambulatory Visit: Payer: Self-pay

## 2023-10-31 DIAGNOSIS — Z302 Encounter for sterilization: Secondary | ICD-10-CM

## 2023-10-31 NOTE — Progress Notes (Unsigned)
 Surgical Physician Order Form [facility]  Dr. Legrand Rams, MD  * Scheduling expectation : Next Available  *Length of Case: 30 minutes  *Clearance needed: no  *Anticoagulation Instructions: Hold all anticoagulants  *Aspirin Instructions: Hold Aspirin  *Post-op visit Date/Instructions: 45-month semen analysis lab  *Diagnosis: Desire for sterilization  *Procedure:  Vasectomy (16109)   Additional orders: N/A  -Admit type: OUTpatient  -Anesthesia: General  -VTE Prophylaxis Standing Order SCD's       Other:   -Standing Lab Orders Per Anesthesia    Lab other: None  -Standing Test orders EKG/Chest x-ray per Anesthesia       Test other:   - Medications:  Ancef 2gm IV  -Other orders:  N/A

## 2023-11-01 ENCOUNTER — Telehealth: Payer: Self-pay

## 2023-11-01 NOTE — Telephone Encounter (Signed)
 Per Dr. Francisca, Patient is to be scheduled for Bilateral Vasectomy   Danny Jenkins was contacted and possible surgical dates were discussed, Monday October 6th, 2025 was agreed upon for surgery.   Patient was directed to call [phone] between 1-3pm the day before surgery to find out surgical arrival time.  Instructions were given not to eat or drink from midnight on the night before surgery and have a driver for the day of surgery. On the surgery day patient was instructed to enter through the Medical Mall entrance of [facility] report the Same Day Surgery desk.   Pre-Admit Testing will be in contact via phone to set up an interview with the anesthesia team to review your history and medications prior to surgery.   Reminder of this information was sent via MyChart to the patient.

## 2023-11-01 NOTE — Progress Notes (Signed)
   [facility] Surgical Posting Form  Surgery Date: Date: 11/13/2023  Surgeon: Dr. Redell Burnet, MD  Inpt ( No  )   Outpt (Yes)   Obs ( No  )   Diagnosis: Z30.2 Desires Sterilization  -CPT: [phone]  Surgery: Bilateral Vasectomy  Stop Anticoagulations: Yes  Cardiac/Medical/Pulmonary Clearance needed: no  *Orders entered into EPIC  Date: 11/01/23   *Case booked in [place]  Date: 11/01/23  *Notified pt of Surgery: Date: 11/01/23  PRE-OP UA & CX: no  *Placed into Prior Authorization Work [place] Date: 11/01/23  Assistant/laser/rep:No

## 2023-11-05 ENCOUNTER — Encounter: Payer: Self-pay | Admitting: Urgent Care

## 2023-11-07 ENCOUNTER — Inpatient Hospital Stay
Admission: RE | Admit: 2023-11-07 | Discharge: 2023-11-07 | Disposition: A | Discharge status: Home / Self Care | Source: Ambulatory Visit

## 2023-11-07 NOTE — Patient Instructions (Signed)
 Your procedure is scheduled on: MONDAY 11/13/23 Report to the Registration Desk on the 1st floor of the Medical Mall. To find out your arrival time, please call [phone] between 1PM - 3PM on:  FRIDAY 11/12/23 If your arrival time is 6:00 am, do not arrive before that time as the Medical Mall entrance doors do not open until 6:00 am.  REMEMBER: Instructions that are not followed completely may result in serious medical risk, up to and including death; or upon the discretion of your surgeon and anesthesiologist your surgery may need to be rescheduled.  Do not eat food after midnight the night before surgery.  No gum chewing or hard candies.  One week prior to surgery: Stop Anti-inflammatories (NSAIDS) such as Advil, Aleve, Ibuprofen, Motrin, Naproxen, Naprosyn and Aspirin based products such as Excedrin, Goody's Powder, BC Powder.  Stop ANY OVER THE COUNTER supplements until after surgery.  You may however, continue to take Tylenol  if needed for pain up until the day of surgery.  Continue taking all of your other prescription medications up until the day of surgery.  ON THE DAY OF SURGERY ONLY TAKE THESE MEDICATIONS WITH SIPS OF WATER:  metoprolol  succinate (TOPROL -XL)  buPROPion (WELLBUTRIN XL)  rosuvastatin  (CRESTOR )   Use inhalers on the day of surgery and bring to the hospital.  No Alcohol for 24 hours before or after surgery.  No Smoking including e-cigarettes for 24 hours before surgery.  No chewable tobacco products for at least 6 hours before surgery.  No nicotine patches on the day of surgery.  Do not use any recreational drugs for at least a week (preferably 2 weeks) before your surgery.  Please be advised that the combination of cocaine and anesthesia may have negative outcomes, up to and including death. If you test positive for cocaine, your surgery will be cancelled.  On the morning of surgery brush your teeth with toothpaste and water, you may rinse your mouth  with mouthwash if you wish. Do not swallow any toothpaste or mouthwash.  Use CHG Soap or wipes as directed on instruction sheet.  Do not wear jewelry, make-up, hairpins, clips or nail polish.  For welded (permanent) jewelry: bracelets, anklets, waist bands, etc.  Please have this removed prior to surgery.  If it is not removed, there is a chance that hospital personnel will need to cut it off on the day of surgery.  Do not wear lotions, powders, or perfumes.   Do not shave body hair from the neck down 48 hours before surgery.  Contact lenses, hearing aids and dentures may not be worn into surgery.  Do not bring valuables to the hospital. [facility] is not responsible for any missing/lost belongings or valuables.   Total Shoulder Arthroplasty:  use Benzoyl Peroxide 5% Gel as directed on instruction sheet.  Bring your C-PAP to the hospital in case you may have to spend the night.   Notify your doctor if there is any change in your medical condition (cold, fever, infection).  Wear comfortable clothing (specific to your surgery type) to the hospital.  After surgery, you can help prevent lung complications by doing breathing exercises.  Take deep breaths and cough every 1-2 hours. Your doctor may order a device called an Incentive Spirometer to help you take deep breaths. When coughing or sneezing, hold a pillow firmly against your incision with both hands. This is called "splinting." Doing this helps protect your incision. It also decreases belly discomfort. creased patient census, it may be  necessary for you, the patient, to continue your postoperative care in the Same Day Surgery department.  If you are being discharged the day of surgery, you will not be allowed to drive home. You will need a responsible individual to drive you home and stay with you for 24 hours after surgery.   If you are taking public transportation, you will need to have a responsible individual with  you.  Please call the Pre-admissions Testing Dept. at [phone] if you have any questions about these instructions.  Surgery Visitation Policy:  Patients having surgery or a procedure may have two visitors.  Children under the age of 83 must have an adult with them who is not the patient.  Merchandiser, retail to address health-related social needs:  [email or web address]

## 2023-11-10 ENCOUNTER — Inpatient Hospital Stay
Admission: RE | Admit: 2023-11-10 | Discharge: 2023-11-10 | Disposition: A | Discharge status: Home / Self Care | Source: Ambulatory Visit

## 2023-11-10 NOTE — Progress Notes (Signed)
 Spoke with patient and he stated he desired to move his surgery to next month. He was encouraged to contact the office to which he said he did.

## 2023-11-10 NOTE — Progress Notes (Signed)
 Unable to get in touch with this patient to do the pre-admit interview. Voice mailbox is not set up .Patient is supposed to be scheduled for OCT 6, Monday.

## 2023-11-13 ENCOUNTER — Ambulatory Visit: Admission: RE | Admit: 2023-11-13 | Source: Home / Self Care | Admitting: Urology

## 2023-11-13 ENCOUNTER — Encounter: Admission: RE | Payer: Self-pay | Source: Home / Self Care

## 2023-11-13 DIAGNOSIS — Z01818 Encounter for other preprocedural examination: Secondary | ICD-10-CM

## 2023-11-13 DIAGNOSIS — I10 Essential (primary) hypertension: Secondary | ICD-10-CM

## 2023-11-13 SURGERY — VASECTOMY
Anesthesia: General | Laterality: Bilateral

## 2023-12-02 ENCOUNTER — Other Ambulatory Visit: Payer: Self-pay | Admitting: Cardiovascular Disease

## 2023-12-31 ENCOUNTER — Other Ambulatory Visit: Payer: Self-pay | Admitting: Cardiovascular Disease

## 2024-01-13 ENCOUNTER — Other Ambulatory Visit: Payer: Self-pay | Admitting: Cardiovascular Disease

## 2024-01-16 NOTE — Telephone Encounter (Signed)
Please contact pt for future appointment. Pt overdue for follow up.

## 2024-02-13 ENCOUNTER — Other Ambulatory Visit: Payer: Self-pay | Admitting: Cardiovascular Disease

## 2024-02-19 ENCOUNTER — Ambulatory Visit: Attending: Cardiology | Admitting: Cardiology

## 2024-02-19 NOTE — Progress Notes (Unsigned)
 " Cardiology Office Note   Date:  02/19/2024  ID:  Danny Jenkins, DOB [birth year], MRN [id number] PCP: Kotturi, Vinay K, MD  [facility] Providers Cardiologist:  Evalene Lunger, MD { Click to update primary MD,subspecialty MD or APP then REFRESH:1}    History of Present Illness Danny Jenkins is a 44 y.o. male with a past medical history of hypertension, hyperlipidemia, morbid obesity, PVCs, who is here today for follow-up.  Initially evaluated by [facility] heart care 10/2019 with Dr. Gollan.  At that time he was noted he had no prior cardiac history but past medical history significant for obesity, hypertension, hyperlipidemia.  Previous admissions included 12/2018 emergency room visit for chest pain felt to be atypical in nature.  He previously wore an event monitor in 07/2020 which showed an average heart rate of 78 bpm with occasional PVCs.  Coronary CTA completed in 07/2020 revealed nonobstructive CAD with advocate lesion is calcified plaque in the OM 2.  Coronary calcium  score of 9.  Advised to consider nonatherosclerotic causes of chest pain and preventative therapy and risk factor modification.  Echocardiogram completed 08/25/2020 showed an LVEF 55 to 60%, no RWMA, no valvular abnormalities.  He was last seen in clinic 10/14/2022 by Dr. Gollan.  At that time he was doing well from the cardiac perspective.  There were no changes to his medication and no further testing that was ordered.  He returns to clinic today  ROS: 10 point review of system has been reviewed and considered negative the exception was been listed in the HPI  Studies Reviewed     2d echo 08/25/2020 1. Left ventricular ejection fraction, by estimation, is 55 to 60%. The  left ventricle has normal function. The left ventricle has no regional  wall motion abnormalities. There is mild left ventricular hypertrophy.  Left ventricular diastolic parameters  were normal.   2. Right ventricular systolic function is  normal. The right ventricular  size is normal.   3. The mitral valve is grossly normal. No evidence of mitral valve  regurgitation.   4. The aortic valve was not well visualized. Aortic valve regurgitation  is not visualized.   cCTA 07/30/2020 IMPRESSION: 1. Poor quality study from significant noise due to obesity, which limits interpretation   2. Coronary calcium  score of 9. There are no age-based percentiles for less than 95 years old, but this would be 46th percentile for age 48.   3. Normal coronary origin with right dominance.   4. Nonobstructive CAD. Most significant lesion is calcified plaque in OM2 that appears to cause mild stenosis, but difficult to interpret in setting of significant noise as above   CAD-RADS 2. Mild non-obstructive CAD (25-49%). Consider non-atherosclerotic causes of chest pain. Consider preventive therapy and risk factor modification.  Event Monitor ([employer]) 07/23/2020 Patch Wear Time:  13 days and 2 hours (2022-05-27T12:22:54-0400 to 2022-06-09T14:45:50-0400)   Patient had a min HR of 52 bpm, max HR of 146 bpm, and avg HR of 78 bpm.  Predominant underlying rhythm was Sinus Rhythm.    Isolated SVEs were rare (<1.0%), SVE Couplets were rare (<1.0%), and SVE Triplets were rare (<1.0%). Isolated VEs were rare (<1.0%),  VE Couplets were rare (<1.0%), and no VE Triplets were present.    Patient triggered events associated with normal sinus rhythm Risk Assessment/Calculations   No BP recorded.  {Refresh Note OR Click here to enter BP  :1}***       Physical Exam VS:  There were no vitals taken  for this visit.       Wt Readings from Last 3 Encounters:  06/30/23 (!) 465 lb (210.9 kg)  05/16/23 (!) 486 lb (220.4 kg)  10/14/22 (!) 486 lb 4 oz (220.6 kg)    GEN: Well nourished, well developed in no acute distress NECK: No JVD; No carotid bruits CARDIAC: ***RRR, no murmurs, rubs, gallops RESPIRATORY:  Clear to auscultation without rales, wheezing or  rhonchi  ABDOMEN: Soft, non-tender, non-distended EXTREMITIES:  No edema; No deformity   ASSESSMENT AND PLAN Hypertension Hyperlipidemia Morbid Obesity     {Are you ordering a CV Procedure (e.g. stress test, cath, DCCV, TEE, etc)?   Press F2        :[id number]}  Dispo: ***  Signed, Shantella Blubaugh, NP   "

## 2024-02-26 ENCOUNTER — Ambulatory Visit: Attending: Medical | Admitting: Medical

## 2024-02-26 ENCOUNTER — Encounter: Payer: Self-pay | Admitting: Medical

## 2024-02-26 VITALS — BP 130/82 | HR 69 | Ht 72.0 in | Wt >= 6400 oz

## 2024-02-26 DIAGNOSIS — R000 Tachycardia, unspecified: Secondary | ICD-10-CM | POA: Diagnosis not present

## 2024-02-26 DIAGNOSIS — E782 Mixed hyperlipidemia: Secondary | ICD-10-CM

## 2024-02-26 DIAGNOSIS — Z79899 Other long term (current) drug therapy: Secondary | ICD-10-CM

## 2024-02-26 DIAGNOSIS — Z6841 Body Mass Index (BMI) 40.0 and over, adult: Secondary | ICD-10-CM | POA: Diagnosis not present

## 2024-02-26 MED ORDER — ROSUVASTATIN CALCIUM 20 MG PO TABS: 20.0000 mg | ORAL_TABLET | Freq: Every day | ORAL | 3 refills | Status: AC

## 2024-02-26 MED ORDER — METOPROLOL SUCCINATE ER 50 MG PO TB24: 50.0000 mg | ORAL_TABLET | Freq: Every day | ORAL | 3 refills | Status: AC

## 2024-02-26 NOTE — Patient Instructions (Signed)
 Medication Instructions:  Your physician recommends that you continue on your current medications as directed. Please refer to the Current Medication list given to you today.    *If you need a refill on your cardiac medications before your next appointment, please call your pharmacy*  Lab Work: Your provider would like for you to have following labs drawn today Lipid, CMP, Direct LDL, CBC, TSH.     Testing/Procedures: No test ordered today   Follow-Up: At [facility], you and your health needs are our priority.  As part of our continuing mission to provide you with exceptional heart care, our providers are all part of one team.  This team includes your primary Cardiologist (physician) and Advanced Practice Providers or APPs (Physician Assistants and Nurse Practitioners) who all work together to provide you with the care you need, when you need it.  Your next appointment:   1 year(s)  Provider:   Evalene Lunger, MD or Cadence Franchester, PA-C

## 2024-02-26 NOTE — Progress Notes (Addendum)
 " Cardiology Office Note   Date:  02/26/2024  ID:  Danny Jenkins, DOB 19-Oct-1980, MRN [id number] PCP: Kotturi, Vinay K, MD  [facility] Providers Cardiologist:  Evalene Lunger, MD   History of Present Illness Danny Jenkins is a 44 y.o. male with a h/o obesity, HTN, HLD, nonobstructive CAD by Cardiac CTA in 2022 who presents for follow-up of irregular heartbeat.   The patient was last seen 10/2022 and was stable from a cardiac perspective. Palpitaitons were controlled on metprolol.   Today, the patient reports he has been doing well. He denies chest pain, SOB, lower leg edema. 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 He stopped taking olmesartan  due to orthostatic symptoms. He denies heart racing or palpitations. He walks 4 days a week. Diet is OK. This year the insurance will cover GLP-1, so he will restart the medications. He needs refills of his cardiac medications.  Studies Reviewed EKG Interpretation Date/Time:  Monday February 26 2024 14:04:42 EST Ventricular Rate:  69 PR Interval:  156 QRS Duration:  90 QT Interval:  396 QTC Calculation: 424 R Axis:   14  Text Interpretation: Normal sinus rhythm Normal ECG When compared with ECG of 14-Oct-2022 11:39, No significant change was found Confirmed by Franchester, Honey Zakarian ([id number]) on 02/26/2024 2:08:18 PM    Echo 08/2020  1. Left ventricular ejection fraction, by estimation, is 55 to 60%. The  left ventricle has normal function. The left ventricle has no regional  wall motion abnormalities. There is mild left ventricular hypertrophy.  Left ventricular diastolic parameters  were normal.   2. Right ventricular systolic function is normal. The right ventricular  size is normal.   3. The mitral valve is grossly normal. No evidence of mitral valve  regurgitation.   4. The aortic valve was not well visualized. Aortic valve regurgitation  is not visualized.   Cardiac CTA 07/2020   IMPRESSION: 1.  Poor quality study from significant noise due to obesity, which limits interpretation   2. Coronary calcium  score of 9. There are no age-based percentiles for less than 26 years old, but this would be 82nd percentile for age 59.   3. Normal coronary origin with right dominance.   4. Nonobstructive CAD. Most significant lesion is calcified plaque in OM2 that appears to cause mild stenosis, but difficult to interpret in setting of significant noise as above   CAD-RADS 2. Mild non-obstructive CAD (25-49%). Consider non-atherosclerotic causes of chest pain. Consider preventive therapy and risk factor modification.  Heart monitor 07/2020 Patient had a min HR of 52 bpm, max HR of 146 bpm, and avg HR of 78 bpm.  Predominant underlying rhythm was Sinus Rhythm.    Isolated SVEs were rare (<1.0%), SVE Couplets were rare (<1.0%), and SVE Triplets were rare (<1.0%). Isolated VEs were rare (<1.0%),  VE Couplets were rare (<1.0%), and no VE Triplets were present.    Patient triggered events associated with normal sinus rhythm      Physical Exam VS:  BP 130/82 (BP Location: Left Wrist, Patient Position: Sitting, Cuff Size: Normal)   Pulse 69   Ht 6' (1.829 m)   Wt (!) 483 lb 9.6 oz (219.4 kg)   SpO2 96%   BMI 65.59 kg/m        Wt Readings from Last 3 Encounters:  02/26/24 (!) 483 lb 9.6 oz (219.4 kg)  06/30/23 (!) 465 lb (210.9 kg)  05/16/23 (!) 486 lb (220.4 kg)    GEN:  Well nourished, well developed in no acute distress NECK: No JVD; No carotid bruits CARDIAC: RRR, no murmurs, rubs, gallops RESPIRATORY:  Clear to auscultation without rales, wheezing or rhonchi  ABDOMEN: Soft, non-tender, non-distended EXTREMITIES:  No edema; No deformity   ASSESSMENT AND PLAN  PVC/Irregular rhythm He denies any symptoms. Continue Toprol  50mg  daily, I will send in refills today.  I will check CMET, CBC, THS and lipids.  Nonobstructive CAD HLD The patient denies anginal symptoms. He plans on  weight loss this year using GLP-1. LDL 76. Continue Crestor  20mg  daily. I will send in refills and update a lipid panel. Continue BB therapy.  Morbid obesity Plans on starting GLP-1 this year for weight loss as above.     Dispo: follow-up in 1 year  Signed, Magaby Rumberger VEAR Fishman, PA-C   "

## 2024-02-27 ENCOUNTER — Ambulatory Visit: Payer: Self-pay | Admitting: Medical

## 2024-02-27 LAB — COMPREHENSIVE METABOLIC PANEL WITH GFR
ALT: 35 IU/L (ref 0–44)
AST: 22 IU/L (ref 0–40)
Albumin: 4.2 g/dL (ref 4.1–5.1)
Alkaline Phosphatase: 88 IU/L (ref 47–123)
BUN/Creatinine Ratio: 16 (ref 9–20)
BUN: 14 mg/dL (ref 6–24)
Bilirubin Total: 0.3 mg/dL (ref 0.0–1.2)
CO2: 21 mmol/L (ref 20–29)
Calcium: 9.3 mg/dL (ref 8.7–10.2)
Chloride: 101 mmol/L (ref 96–106)
Creatinine, Ser: 0.85 mg/dL (ref 0.76–1.27)
Globulin, Total: 2.3 g/dL (ref 1.5–4.5)
Glucose: 95 mg/dL (ref 70–99)
Potassium: 4.1 mmol/L (ref 3.5–5.2)
Sodium: 136 mmol/L (ref 134–144)
Total Protein: 6.5 g/dL (ref 6.0–8.5)
eGFR: 109 mL/min/1.73

## 2024-02-27 LAB — LDL CHOLESTEROL, DIRECT: LDL Direct: 80 mg/dL (ref 0–99)

## 2024-02-27 LAB — CBC
Hematocrit: 40.9 % (ref 37.5–51.0)
Hemoglobin: 13.4 g/dL (ref 13.0–17.7)
MCH: 27.3 pg (ref 26.6–33.0)
MCHC: 32.8 g/dL (ref 31.5–35.7)
MCV: 83 fL (ref 79–97)
Platelets: 266 x10E3/uL (ref 150–450)
RBC: 4.91 x10E6/uL (ref 4.14–5.80)
RDW: 14.4 % (ref 11.6–15.4)
WBC: 7.1 x10E3/uL (ref 3.4–10.8)

## 2024-02-27 LAB — LIPID PANEL
Chol/HDL Ratio: 3.2 ratio (ref 0.0–5.0)
Cholesterol, Total: 155 mg/dL (ref 100–199)
HDL: 49 mg/dL
LDL Chol Calc (NIH): 74 mg/dL (ref 0–99)
Triglycerides: 192 mg/dL — ABNORMAL HIGH (ref 0–149)
VLDL Cholesterol Cal: 32 mg/dL (ref 5–40)

## 2024-02-27 LAB — TSH: TSH: 2.45 u[IU]/mL (ref 0.450–4.500)
